# Patient Record
Sex: Male | Born: 1935 | Race: White | Hispanic: No | Smoking: Former smoker
Health system: Southern US, Community
[De-identification: ages and names within clinical notes are randomized; demographics above are authoritative.]

## PROBLEM LIST (undated history)

## (undated) DIAGNOSIS — N189 Chronic kidney disease, unspecified: Secondary | ICD-10-CM

## (undated) DIAGNOSIS — J189 Pneumonia, unspecified organism: Secondary | ICD-10-CM

## (undated) DIAGNOSIS — K219 Gastro-esophageal reflux disease without esophagitis: Secondary | ICD-10-CM

## (undated) DIAGNOSIS — E785 Hyperlipidemia, unspecified: Secondary | ICD-10-CM

## (undated) DIAGNOSIS — C801 Malignant (primary) neoplasm, unspecified: Secondary | ICD-10-CM

## (undated) DIAGNOSIS — T4145XA Adverse effect of unspecified anesthetic, initial encounter: Secondary | ICD-10-CM

## (undated) DIAGNOSIS — G4730 Sleep apnea, unspecified: Secondary | ICD-10-CM

## (undated) DIAGNOSIS — I10 Essential (primary) hypertension: Secondary | ICD-10-CM

## (undated) DIAGNOSIS — T8859XA Other complications of anesthesia, initial encounter: Secondary | ICD-10-CM

## (undated) DIAGNOSIS — H3530 Unspecified macular degeneration: Secondary | ICD-10-CM

## (undated) HISTORY — PX: UVULOPALATOPHARYNGOPLASTY: SHX827

## (undated) HISTORY — PX: CATARACT EXTRACTION, BILATERAL: SHX1313

## (undated) HISTORY — PX: BACK SURGERY: SHX140

---

## 2006-09-16 ENCOUNTER — Ambulatory Visit: Payer: Self-pay | Admitting: Physician Assistant

## 2006-12-15 ENCOUNTER — Ambulatory Visit: Payer: Self-pay | Admitting: Unknown Physician Specialty

## 2006-12-28 ENCOUNTER — Inpatient Hospital Stay: Payer: Self-pay | Admitting: Unknown Physician Specialty

## 2007-03-17 ENCOUNTER — Ambulatory Visit: Payer: Self-pay | Admitting: Internal Medicine

## 2010-06-18 ENCOUNTER — Ambulatory Visit
Admission: RE | Admit: 2010-06-18 | Discharge: 2010-06-18 | Payer: Self-pay | Source: Home / Self Care | Attending: Family Medicine | Admitting: Family Medicine

## 2010-07-17 NOTE — Assessment & Plan Note (Signed)
Summary: cold?/jbb   Vital Signs:  Patient Profile:   77 Years Old Male CC:      Cold & URI symptoms Height:     69 inches Weight:      237 pounds BMI:     35.13 O2 Sat:      94 % O2 treatment:    Room Air Temp:     97.7 degrees F tympanic Pulse rate:   76 / minute Pulse rhythm:   regular Resp:     20 per minute BP sitting:   147 / 82  (right arm)  Pt. in pain?   no  Vitals Entered By: Levonne Spiller EMT-P (June 18, 2010 12:42 PM)              Is Patient Diabetic? No  Does patient need assistance? Functional Status Self care Ambulation Normal      Current Allergies: No known allergies History of Present Illness History from: patient Chief Complaint: Cold & URI symptoms History of Present Illness: The patient is reporting that he has been fighting cold and congestion for 2 weeks.  He says that 2 weeks ago he called his PCP and received a Zpack and his symptoms started to improve but never fully resolved and symptoms returned worse after he completed the Zpack.  He is coughing and producing yellow sputum and he has been wheezing at night.  He reports that he has had sinus drainage and pressure.  He has been having some nasal congestion and low grade fever.  The patient says that he had his flu vaccine this season and is current with his pneumonia vaccine.  The patient says that he had bilateral pneumonia about 20 years ago.     REVIEW OF SYSTEMS Constitutional Symptoms       Complains of fever, chills, and night sweats.     Denies weight loss, weight gain, and fatigue.  Eyes       Denies change in vision, eye pain, eye discharge, glasses, contact lenses, and eye surgery. Ear/Nose/Throat/Mouth       Complains of ear pain, frequent runny nose, sinus problems, and hoarseness.      Denies hearing loss/aids, change in hearing, ear discharge, dizziness, frequent nose bleeds, sore throat, and tooth pain or bleeding.  Respiratory       Complains of productive cough and  wheezing.      Denies dry cough, shortness of breath, asthma, bronchitis, and emphysema/COPD.      Comments: Colored Sputum Cardiovascular       Denies murmurs, chest pain, and tires easily with exhertion.    Gastrointestinal       Denies stomach pain, nausea/vomiting, diarrhea, constipation, blood in bowel movements, and indigestion. Genitourniary       Denies painful urination, kidney stones, and loss of urinary control. Neurological       Denies paralysis, seizures, and fainting/blackouts. Musculoskeletal       Denies muscle pain, joint pain, joint stiffness, decreased range of motion, redness, swelling, muscle weakness, and gout.  Skin       Denies bruising, unusual mles/lumps or sores, and hair/skin or nail changes.  Psych       Denies mood changes, temper/anger issues, anxiety/stress, speech problems, depression, and sleep problems. Other Comments: Bilateral Pain (Minor) Pt. also has Minor wheezing bilaterally.   Past History:  Past Medical History: Hypertension Hyperlipidemia  Past Surgical History: Back Surgery  Family History: Pt only reports that parents and siblings are deceased.  Social History: Pt is married.  His wife is currently suffering with dementia but still [place].  Pt smoked for 12 years but quit in the 1940s.   Physical Exam General appearance: well developed, well nourished, no acute distress Head: normocephalic, atraumatic Eyes: conjunctivae and lids normal Pupils: equal, round, reactive to light Ears: normal, no lesions or deformities Nasal: swollen red turbinates with congestion, thick yellow discharge Oral/Pharynx: tongue normal, posterior pharynx without erythema or exudate Neck: neck supple,  trachea midline, no masses Chest/Lungs: anterior apical exp wheezes in the right lung heard, no crackles Heart: regular rate and  rhythm, no murmur Abdomen: soft, non-tender without obvious organomegaly Extremities: normal  extremities Neurological: grossly intact and non-focal Skin: no obvious rashes or lesions MSE: oriented to time, place, and person Assessment New Problems: ACUTE SINUSITIS, UNSPECIFIED (ICD-461.9) ACUTE BRONCHITIS (ICD-466.0)   Patient Education: Patient and/or caregiver instructed in the following: rest, fluids. The risks, benefits and possible side effects were clearly explained and discussed with the patient.  The patient verbalized clear understanding.  The patient was given instructions to return if symptoms don't improve, worsen or new changes develop.  If it is not during clinic hours and the patient cannot get back to this clinic then the patient was told to seek medical care at an available urgent care or emergency department.  The patient verbalized understanding.   Demonstrates willingness to comply.  Plan New Medications/Changes: DELSYM 30 MG/5ML LQCR (DEXTROMETHORPHAN POLISTIREX) take 1 teaspoon by mouth every 12 hours as needed cough  #60 mL x 0, 06/18/2010, Clanford Zeidan MD VENTOLIN HFA 108 (90 BASE) MCG/ACT AERS (ALBUTEROL SULFATE) 2 puffs every 4 hours as needed wheezing, cough, SOB  #1 x 0, 06/18/2010, Clanford Choo MD DOXYCYCLINE HYCLATE 100 MG CAPS (DOXYCYCLINE HYCLATE) take 1 by mouth two times a day with food until completed  #20 x 0, 06/18/2010, Clanford Labate MD  Follow Up: Follow up in 2-3 days if no improvement, Follow up on an as needed basis, Follow up with Primary Physician  The patient and/or caregiver has been counseled thoroughly with regard to medications prescribed including dosage, schedule, interactions, rationale for use, and possible side effects and they verbalize understanding.  Diagnoses and expected course of recovery discussed and will return if not improved as expected or if the condition worsens. Patient and/or caregiver verbalized understanding.  Prescriptions: DELSYM 30 MG/5ML LQCR (DEXTROMETHORPHAN POLISTIREX) take 1 teaspoon by mouth  every 12 hours as needed cough  #60 mL x 0   Entered and Authorized by:   Standley Dakins MD   Signed by:   Standley Dakins MD on 06/18/2010   Method used:   Electronically to        [facility] [phone]* (retail)       [address] [place], [place]  [address]       Ph: [phone]       Fax: [phone]   RxID:   [id number] VENTOLIN HFA 108 (90 BASE) MCG/ACT AERS (ALBUTEROL SULFATE) 2 puffs every 4 hours as needed wheezing, cough, SOB  #1 x 0   Entered and Authorized by:   Standley Dakins MD   Signed by:   Standley Dakins MD on 06/18/2010   Method used:   Electronically to        [facility] [employer] [phone]* (retail)       [address]  [place], [place]  [address]       Ph: [phone]       Fax: [phone]   RxID:   [id number] DOXYCYCLINE HYCLATE 100 MG CAPS (DOXYCYCLINE HYCLATE) take 1 by mouth two times a day with food until completed  #20 x 0   Entered and Authorized by:   Standley Dakins MD   Signed by:   Standley Dakins MD on 06/18/2010   Method used:   Electronically to        [facility] [phone]* (retail)       [address] [place], [place]  [address]       Ph: [phone]       Fax: [phone]   RxID:   [id number]   Patient Instructions: 1)  Take your antibiotic as prescribed until ALL of it is gone, but stop if you develop a rash or swelling and contact our office as soon as possible. 2)  Acute sinusitis symptoms for less than 10 days are not helped by antibiotics.Use warm moist compresses, and over the counter decongestants ( only as directed). Call if no improvement in 5-7 days, sooner if increasing pain, fever, or new symptoms. 3)  Acute bronchitis symptoms for less than 10 days are not helped by antibiotics. take over the counter cough medications. call if no improvment in  5-7 days, sooner if increasing cough, fever, or new symptoms( shortness of breath, chest pain). 4)  Return or go to the ER if no  improvement or symptoms getting worse.   5)  The patient was informed that there is no on-call provider or services available at this clinic during off-hours (when the clinic is closed).  If the patient developed a problem or concern that required immediate attention, the patient was advised to go the the nearest available urgent care or emergency department for medical care.  The patient verbalized understanding.    6)  Go to the pharmacy and pick up your prescription (s).  It may take up to 30 mins for electronic prescriptions to be delivered to the pharmacy.  Please call if your pharmacy has not received your prescriptions after 30 minutes.    Medication Administration  Injection # 1:    Medication: Depo- Medrol 80mg     Diagnosis: ACUTE BRONCHITIS (ICD-466.0)    Route: IM    Site: RUOQ gluteus    Exp Date: 01/13/2011    Lot #: [id number]    Mfr: PFIZER    Patient tolerated injection without complications    Given by: Levonne Spiller EMT-P (June 18, 2010 1:13 PM)  Medication # 1:    Medication: Albuterol Sulfate Sol 2.5mg  unit dose    Diagnosis: URI    Dose: 1    Route: inhaled    Exp Date: 06/15/2011    Lot #: [id number]    Mfr: NEPHRON Rx    Patient tolerated medication without complications    Given by: Levonne Spiller EMT-P (June 18, 2010 1:14 PM)

## 2010-08-14 ENCOUNTER — Encounter: Payer: Self-pay | Admitting: Family Medicine

## 2010-08-14 ENCOUNTER — Ambulatory Visit (INDEPENDENT_AMBULATORY_CARE_PROVIDER_SITE_OTHER): Payer: Medicare Other | Admitting: Family Medicine

## 2010-08-14 DIAGNOSIS — I10 Essential (primary) hypertension: Secondary | ICD-10-CM | POA: Insufficient documentation

## 2010-08-14 DIAGNOSIS — J309 Allergic rhinitis, unspecified: Secondary | ICD-10-CM | POA: Insufficient documentation

## 2010-08-14 DIAGNOSIS — J019 Acute sinusitis, unspecified: Secondary | ICD-10-CM

## 2010-08-14 DIAGNOSIS — M5430 Sciatica, unspecified side: Secondary | ICD-10-CM | POA: Insufficient documentation

## 2010-08-21 NOTE — Assessment & Plan Note (Signed)
Summary: SINUS PROBLEM   Vital Signs:  Patient Profile:   77 Years Old Male CC:      Ear Congestion Height:     69 inches Weight:      238 pounds O2 Sat:      94 % O2 treatment:    Room Air Temp:     98.6 degrees F oral Pulse rate:   75 / minute Pulse rhythm:   regular Resp:     14 per minute BP sitting:   131 / 74  (right arm)  Pt. in pain?   no                   Current Allergies: No known allergies History of Present Illness History from: patient Chief Complaint: Ear Congestion History of Present Illness: The patient presented today with complaints of 3 to 4 days of progressive itching in the ears and nasal congestion and feeling fluid in the sinus and some dizziness.  He said that he cut grass 3 days ago and forgot to wear his mask and noticed the symptoms getting worse after that.  He denies fever and chills.  He says that he is feeling pressure in maxillary sinuses.  No CP or SOB.   He is having dry nonproductive cough.    REVIEW OF SYSTEMS Constitutional Symptoms      Denies fever, chills, night sweats, weight loss, weight gain, and fatigue.  Eyes       Denies change in vision, eye pain, eye discharge, glasses, contact lenses, and eye surgery. Ear/Nose/Throat/Mouth       Complains of dizziness, frequent runny nose, and sinus problems.      Denies hearing loss/aids, change in hearing, ear pain, ear discharge, frequent nose bleeds, sore throat, hoarseness, and tooth pain or bleeding.  Respiratory       Denies dry cough, productive cough, wheezing, shortness of breath, asthma, bronchitis, and emphysema/COPD.  Cardiovascular       Denies murmurs, chest pain, and tires easily with exhertion.    Gastrointestinal       Denies stomach pain, nausea/vomiting, diarrhea, constipation, blood in bowel movements, and indigestion. Genitourniary       Denies painful urination, kidney stones, and loss of urinary control. Neurological       Denies paralysis, seizures, and  fainting/blackouts. Musculoskeletal       Complains of muscle pain.      Denies joint pain, joint stiffness, decreased range of motion, redness, swelling, muscle weakness, and gout.      Comments: right sciatica and chronic LBP Skin       Denies bruising, unusual mles/lumps or sores, and hair/skin or nail changes.  Psych       Denies mood changes, temper/anger issues, anxiety/stress, speech problems, depression, and sleep problems.  Past History:  Past Surgical History: Last updated: 06/18/2010 Back Surgery  Family History: Last updated: 06/18/2010 Pt only reports that parents and siblings are deceased.  Social History: Last updated: 06/18/2010 Pt is married.  His wife is currently suffering with dementia but still living at [place].  Pt smoked for 12 years but quit in the 1940s.    Past Medical History: Hypertension Hyperlipidemia Allergic Rhinitis Sciatica Right Chronic LBP  Family History: Reviewed history from 06/18/2010 and no changes required. Pt only reports that parents and siblings are deceased.  Social History: Reviewed history from 06/18/2010 and no changes required. Pt is married.  His wife is currently suffering with dementia  but still living at home [place] their [place].  Pt smoked for 12 years but quit in the 1940s.   Physical Exam General appearance: well developed, well nourished, no acute distress Head: normocephalic, atraumatic Eyes: conjunctivae and lids normal Pupils: equal, round, reactive to light Ears: normal, no lesions or deformities, wax in left canal Nasal: swollen red turbinates with congestion Oral/Pharynx: tongue normal, posterior pharynx without erythema or exudate Neck: neck supple,  trachea midline, no masses Chest/Lungs: no rales, wheezes, or rhonchi bilateral, breath sounds equal without effort Heart: regular rate and  rhythm, no murmur Extremities: normal extremities Neurological: grossly intact and  non-focal Skin: no obvious rashes or lesions MSE: oriented to time, place, and person Assessment New Problems: SCIATICA, RIGHT (ICD-724.3) UNSPECIFIED ESSENTIAL HYPERTENSION (ICD-401.9) ALLERGIC RHINITIS CAUSE UNSPECIFIED (ICD-477.9)   Patient Education: Patient and/or caregiver instructed in the following: rest. The risks, benefits and possible side effects were clearly explained and discussed with the patient.  The patient verbalized clear understanding.  The patient was given instructions to return if symptoms don't improve, worsen or new changes develop.  If it is not during clinic hours and the patient cannot get back to this clinic then the patient was told to seek medical care at an available urgent care or emergency department.  The patient verbalized understanding.   Demonstrates willingness to comply.  Plan New Medications/Changes: LORATADINE 10 MG TABS (LORATADINE) take 1 by mouth daily  #20 x 0, 08/14/2010, Clanford Joost MD FLUTICASONE PROPIONATE 50 MCG/ACT SUSP (FLUTICASONE PROPIONATE) 2 sprays per nostril once daily  #1 x 1, 08/14/2010, Clanford Downum MD AZITHROMYCIN 250 MG TABS (AZITHROMYCIN) take 2 tabs by mouth on day 1, then 1 tab by mouth daily until completed  #6 x 0, 08/14/2010, Clanford Gasper MD  Planning Comments:   Wear Nasal/Oral Mask when cutting grass.  Leave windows closed during high pollen season.  Follow Up: Follow up in 2-3 days if no improvement, Follow up on an as needed basis, Follow up with Primary Physician  The patient and/or caregiver has been counseled thoroughly with regard to medications prescribed including dosage, schedule, interactions, rationale for use, and possible side effects and they verbalize understanding.  Diagnoses and expected course of recovery discussed and will return if not improved as expected or if the condition worsens. Patient and/or caregiver verbalized understanding.  Prescriptions: LORATADINE 10 MG TABS  (LORATADINE) take 1 by mouth daily  #20 x 0   Entered and Authorized by:   Standley Dakins MD   Signed by:   Standley Dakins MD on 08/14/2010   Method used:   Electronically to        [facility] [phone]* (retail)       [address] [place], [place]  [address]       Ph: [phone]       Fax: [phone]   RxID:   [id number] FLUTICASONE PROPIONATE 50 MCG/ACT SUSP (FLUTICASONE PROPIONATE) 2 sprays per nostril once daily  #1 x 1   Entered and Authorized by:   Standley Dakins MD   Signed by:   Standley Dakins MD on 08/14/2010   Method used:   Electronically to        [facility] [phone]* (retail)       [address] [place], [place]  [address]       Ph: [phone]       Fax: [phone]  RxID:   [id number] AZITHROMYCIN 250 MG TABS (AZITHROMYCIN) take 2 tabs by mouth on day 1, then 1 tab by mouth daily until completed  #6 x 0   Entered and Authorized by:   Standley Dakins MD   Signed by:   Standley Dakins MD on 08/14/2010   Method used:   Electronically to        [facility] [phone]* (retail)       [address] [place], [place]  [address]       Ph: [phone]       Fax: [phone]   RxID:   [id number]   Patient Instructions: 1)  Go to the pharmacy and pick up your prescription (s).  It may take up to 30 mins for electronic prescriptions to be delivered to the pharmacy.  Please call if your pharmacy has not received your prescriptions after 30 minutes.   2)  Take your antibiotic as prescribed until ALL of it is gone, but stop if you develop a rash or swelling and contact our office as soon as possible. 3)  Acute sinusitis symptoms for less than 10 days are not helped by antibiotics.Use warm moist compresses, and over the counter decongestants ( only as directed). Call if no improvement in 5-7 days, sooner if increasing pain, fever, or new symptoms. 4)  The patient was informed that there is no on-call provider or  services available at this clinic during off-hours (when the clinic is closed).  If the patient developed a problem or concern that required immediate attention, the patient was advised to go the the nearest available urgent care or emergency department for medical care.  The patient verbalized understanding.    5)  Return or go to the ER if no improvement or symptoms getting worse.

## 2010-08-27 ENCOUNTER — Ambulatory Visit (INDEPENDENT_AMBULATORY_CARE_PROVIDER_SITE_OTHER): Payer: Medicare Other | Admitting: Internal Medicine

## 2010-08-27 ENCOUNTER — Encounter: Payer: Self-pay | Admitting: Internal Medicine

## 2010-08-27 DIAGNOSIS — E860 Dehydration: Secondary | ICD-10-CM | POA: Insufficient documentation

## 2010-08-27 DIAGNOSIS — G4700 Insomnia, unspecified: Secondary | ICD-10-CM | POA: Insufficient documentation

## 2010-08-27 DIAGNOSIS — H8309 Labyrinthitis, unspecified ear: Secondary | ICD-10-CM

## 2010-09-02 NOTE — Assessment & Plan Note (Signed)
Summary: inner ear, allergies/jbb   Vital Signs:  Patient profile:   77 year old male Temp:     98.2 degrees F Pulse rate:   72 / minute Resp:     14 per minute BP sitting:   108 / 58  (left arm)  CC:  "inner ear" for 2 days.  History of Present Illness: Patient here 3/1 for allergic rhinnitis and is better but not back to baseline. Loratidine didn't seem to help. Pollen counts are very high with extreme warm weather this year. 2 days ago started to have mild dizziness with spinning and assoc with ears popping. Worse with rapid head movement. Had this yrs ago once and didn't last long. Denies diplopia, headache, syncope, weakness, numbness, etc. Has had darker and scanter urine and thinks he doesn't drink enough. Also requested sleeping pills since it takes him 2 hrs to get to sleep. Doesn't feel tired. Sometimes naps.  Allergies: No Known Drug Allergies  Past History:  Past Medical History: Last updated: 08/14/2010 Hypertension Hyperlipidemia Allergic Rhinitis Sciatica Right Chronic LBP  Past Surgical History: Last updated: 06/18/2010 Back Surgery  Social History: Last updated: 06/18/2010 Pt is married.  His wife is currently suffering with dementia but still living at home [place] their [place].  Pt smoked for 12 years but quit in the 1940s.    Review of Systems  The patient denies anorexia, fever, weight loss, weight gain, vision loss, decreased hearing, hoarseness, chest pain, syncope, dyspnea on exertion, peripheral edema, prolonged cough, headaches, hemoptysis, abdominal pain, melena, hematochezia, severe indigestion/heartburn, hematuria, incontinence, genital sores, muscle weakness, suspicious skin lesions, transient blindness, difficulty walking, depression, unusual weight change, abnormal bleeding, enlarged lymph nodes, angioedema, breast masses, and testicular masses.   ENT:  Complains of nasal congestion and postnasal drainage; ears popping, sneezing.Marland Kitchen  Physical  Exam  General:  Well-developed,well-nourished,in no acute distress; alert,appropriate and cooperative throughout examination Head:  Normocephalic and atraumatic without obvious abnormalities. No apparent alopecia or balding. Eyes:  No corneal or conjunctival inflammation noted. EOMI. Perrla.  No nystagmus. Vision grossly normal. Ears:  External ear exam shows no significant lesions or deformities.  Otoscopic examination reveals much cerumen but visible tympanic membranes are clear. Hearing is grossly normal bilaterally. Nose:  mucosal pallor Mouth:  Oral mucosa and oropharynx without lesions, pnd, or exudates.   Neck:  No deformities, masses, or tenderness noted. carotids 2+ without bruits Lungs:  Normal respiratory effort, chest expands symmetrically. Lungs are clear to auscultation, no crackles or wheezes. Heart:  Normal rate and regular rhythm.  Extremities:  No clubbing, cyanosis, edema.   Neurologic:  No cranial nerve deficits noted. Station and gait are normal.  Sensory, motor and coordinative functions appear intact. Cervical Nodes:  No lymphadenopathy noted Psych:  Cognition and judgment appear intact. Alert and cooperative with normal attention span and concentration. No apparent delusions, illusions, hallucinations   Problems:  Medical Problems Added: 1)  Dx of Insomnia Unspecified  (ICD-780.52) 2)  Dx of Dehydration  (ICD-276.51) 3)  Dx of Labrynthitis  (ICD-386.30)  Impression & Recommendations:  Problem # 1:  INSOMNIA UNSPECIFIED (ICD-780.52) mild  Problem # 2:  DEHYDRATION (ICD-276.51) mild  Problem # 3:  LABRYNTHITIS (ICD-386.30) Assessment: Comment Only mild. Discussed need for CT/MRI if progressed or persisted  Problem # 4:  UNSPECIFIED ESSENTIAL HYPERTENSION (ICD-401.9) Discussed possible complication of decongestants and need to monitor at home with own equip. His updated medication list for this problem includes:    Hydrochlorothiazide 25 Mg Tabs  (  Hydrochlorothiazide) .Marland Kitchen... 1 tab per day    Amlodipine Besylate 5 Mg Tabs (Amlodipine besylate) .Marland Kitchen... 1 tab per day    Lisinopril 40 Mg Tabs (Lisinopril) .Marland Kitchen... 1 tab per day  Complete Medication List: 1)  Hydrochlorothiazide 25 Mg Tabs (Hydrochlorothiazide) .Marland Kitchen.. 1 tab per day 2)  Travatan Z 0.004 % Soln (Travoprost) .Marland Kitchen.. 1 drop each eye at bedtime 3)  Amlodipine Besylate 5 Mg Tabs (Amlodipine besylate) .Marland Kitchen.. 1 tab per day 4)  Lisinopril 40 Mg Tabs (Lisinopril) .Marland Kitchen.. 1 tab per day 5)  Simvastatin 40 Mg Tabs (Simvastatin) .Marland Kitchen.. 1 tab per day 6)  Latanoprost 0.005 % Soln (Latanoprost) .Marland Kitchen.. 1 drop each eye at bedtime 7)  Ventolin Hfa 108 (90 Base) Mcg/act Aers (Albuterol sulfate) .... 2 puffs every 4 hours as needed wheezing, cough, sob 8)  Delsym 30 Mg/104ml Lqcr (Dextromethorphan polistirex) .... Take 1 teaspoon by mouth every 12 hours as needed cough 9)  Fluticasone Propionate 50 Mcg/act Susp (Fluticasone propionate) .... 2 sprays per nostril once daily 10)  Loratadine 10 Mg Tabs (Loratadine) .... Take 1 by mouth daily 11)  Antivert 25 Mg Tabs (Meclizine hcl) .Marland Kitchen.. 1 by mouth four times daily as needed  dizziness 12)  Gabapentin 600 Mg Tabs (Gabapentin) .Marland Kitchen.. 1 by mouth qhs  Patient Instructions: 1)  afrin generic 2 sprays in each nostril every 12 hours for 3 days maximum followed by 3 days of non-use. Continue cycle as needed to control nasal or ear congestion . 2)  loratidine-D every 12 hrs and check your BP at home.140/90 3)  careful activity such as driving, stair climbing. 4)  If worse or not better soon, consult Dr. Bennett Scrape or ENT. 5)  Increase water intake. 6)  Avoid stimulants such as caffeine. 7)  Don't nap. 8)  Relaxation routine. 9)  melatonin. Prescriptions: ANTIVERT 25 MG TABS (MECLIZINE HCL) 1 by mouth four times daily as needed  dizziness  #30 x 1   Entered and Authorized by:   J. Juline Patch MD   Signed by:   Shela Commons. Juline Patch MD on 08/27/2010   Method used:   Electronically  to        [facility] [phone]* (retail)       [address] [place], [place]  [address]       Ph: [phone]       Fax: [phone]   RxID:   [id number]

## 2010-09-11 NOTE — Letter (Signed)
Summary: history form   history form   Imported By: Eugenio Hoes 09/01/2010 14:50:57  _____________________________________________________________________  External Attachment:    Type:   Image     Comment:   External Document

## 2010-09-16 NOTE — Letter (Signed)
Summary: History form  History form   Imported By: Eugenio Hoes 09/08/2010 09:49:55  _____________________________________________________________________  External Attachment:    Type:   Image     Comment:   External Document

## 2013-04-05 DIAGNOSIS — C44320 Squamous cell carcinoma of skin of unspecified parts of face: Secondary | ICD-10-CM | POA: Insufficient documentation

## 2013-05-25 ENCOUNTER — Ambulatory Visit: Payer: Self-pay | Admitting: Internal Medicine

## 2013-06-26 ENCOUNTER — Ambulatory Visit: Payer: Self-pay | Admitting: Internal Medicine

## 2013-12-26 ENCOUNTER — Ambulatory Visit: Payer: Self-pay | Admitting: Internal Medicine

## 2014-09-25 ENCOUNTER — Ambulatory Visit
Admit: 2014-09-25 | Disposition: A | Discharge status: Home / Self Care | Payer: Self-pay | Attending: Internal Medicine | Admitting: Internal Medicine

## 2015-04-19 ENCOUNTER — Other Ambulatory Visit: Payer: Self-pay | Admitting: Internal Medicine

## 2015-04-19 DIAGNOSIS — R911 Solitary pulmonary nodule: Secondary | ICD-10-CM

## 2015-04-22 ENCOUNTER — Ambulatory Visit
Admission: RE | Admit: 2015-04-22 | Discharge: 2015-04-22 | Disposition: A | Discharge status: Home / Self Care | Payer: Commercial Managed Care - HMO | Source: Ambulatory Visit | Attending: Internal Medicine | Admitting: Internal Medicine

## 2015-04-22 DIAGNOSIS — I2510 Atherosclerotic heart disease of native coronary artery without angina pectoris: Secondary | ICD-10-CM | POA: Diagnosis not present

## 2015-04-22 DIAGNOSIS — R911 Solitary pulmonary nodule: Secondary | ICD-10-CM | POA: Diagnosis not present

## 2015-04-22 HISTORY — DX: Malignant (primary) neoplasm, unspecified: C80.1

## 2015-04-22 HISTORY — DX: Essential (primary) hypertension: I10

## 2015-04-22 MED ORDER — IOHEXOL 300 MG/ML  SOLN
75.0000 mL | Freq: Once | INTRAMUSCULAR | Status: AC | PRN
  Administered 2015-04-22: 75 mL via INTRAVENOUS

## 2015-08-06 DIAGNOSIS — R911 Solitary pulmonary nodule: Secondary | ICD-10-CM | POA: Diagnosis not present

## 2015-08-06 DIAGNOSIS — M150 Primary generalized (osteo)arthritis: Secondary | ICD-10-CM | POA: Diagnosis not present

## 2015-08-06 DIAGNOSIS — Z23 Encounter for immunization: Secondary | ICD-10-CM | POA: Diagnosis not present

## 2015-08-06 DIAGNOSIS — I10 Essential (primary) hypertension: Secondary | ICD-10-CM | POA: Diagnosis not present

## 2015-08-06 DIAGNOSIS — E7800 Pure hypercholesterolemia, unspecified: Secondary | ICD-10-CM | POA: Diagnosis not present

## 2015-08-06 DIAGNOSIS — G4733 Obstructive sleep apnea (adult) (pediatric): Secondary | ICD-10-CM | POA: Diagnosis not present

## 2015-08-13 DIAGNOSIS — M150 Primary generalized (osteo)arthritis: Secondary | ICD-10-CM | POA: Diagnosis not present

## 2015-08-13 DIAGNOSIS — F5105 Insomnia due to other mental disorder: Secondary | ICD-10-CM | POA: Diagnosis not present

## 2015-08-13 DIAGNOSIS — F4321 Adjustment disorder with depressed mood: Secondary | ICD-10-CM | POA: Diagnosis not present

## 2015-08-13 DIAGNOSIS — E7800 Pure hypercholesterolemia, unspecified: Secondary | ICD-10-CM | POA: Diagnosis not present

## 2015-08-13 DIAGNOSIS — Z23 Encounter for immunization: Secondary | ICD-10-CM | POA: Diagnosis not present

## 2015-08-13 DIAGNOSIS — I10 Essential (primary) hypertension: Secondary | ICD-10-CM | POA: Diagnosis not present

## 2015-08-28 DIAGNOSIS — H40053 Ocular hypertension, bilateral: Secondary | ICD-10-CM | POA: Diagnosis not present

## 2015-08-30 DIAGNOSIS — Z85828 Personal history of other malignant neoplasm of skin: Secondary | ICD-10-CM | POA: Diagnosis not present

## 2015-08-30 DIAGNOSIS — D1801 Hemangioma of skin and subcutaneous tissue: Secondary | ICD-10-CM | POA: Diagnosis not present

## 2015-08-30 DIAGNOSIS — L821 Other seborrheic keratosis: Secondary | ICD-10-CM | POA: Diagnosis not present

## 2015-08-30 DIAGNOSIS — L570 Actinic keratosis: Secondary | ICD-10-CM | POA: Diagnosis not present

## 2015-09-17 DIAGNOSIS — I10 Essential (primary) hypertension: Secondary | ICD-10-CM | POA: Diagnosis not present

## 2015-09-17 DIAGNOSIS — E7800 Pure hypercholesterolemia, unspecified: Secondary | ICD-10-CM | POA: Diagnosis not present

## 2015-09-17 DIAGNOSIS — M150 Primary generalized (osteo)arthritis: Secondary | ICD-10-CM | POA: Diagnosis not present

## 2015-09-17 DIAGNOSIS — R0789 Other chest pain: Secondary | ICD-10-CM | POA: Diagnosis not present

## 2015-11-15 ENCOUNTER — Other Ambulatory Visit: Payer: Self-pay | Admitting: Internal Medicine

## 2015-11-15 ENCOUNTER — Ambulatory Visit
Admission: RE | Admit: 2015-11-15 | Discharge: 2015-11-15 | Disposition: A | Discharge status: Home / Self Care | Payer: PPO | Source: Ambulatory Visit | Attending: Internal Medicine | Admitting: Internal Medicine

## 2015-11-15 DIAGNOSIS — I10 Essential (primary) hypertension: Secondary | ICD-10-CM | POA: Diagnosis not present

## 2015-11-15 DIAGNOSIS — M5136 Other intervertebral disc degeneration, lumbar region: Secondary | ICD-10-CM | POA: Diagnosis not present

## 2015-11-15 DIAGNOSIS — I700 Atherosclerosis of aorta: Secondary | ICD-10-CM | POA: Diagnosis not present

## 2015-11-15 DIAGNOSIS — M150 Primary generalized (osteo)arthritis: Secondary | ICD-10-CM | POA: Diagnosis not present

## 2015-11-15 DIAGNOSIS — K4090 Unilateral inguinal hernia, without obstruction or gangrene, not specified as recurrent: Secondary | ICD-10-CM | POA: Insufficient documentation

## 2015-11-15 DIAGNOSIS — Z77090 Contact with and (suspected) exposure to asbestos: Secondary | ICD-10-CM | POA: Diagnosis not present

## 2015-11-15 DIAGNOSIS — Z87442 Personal history of urinary calculi: Secondary | ICD-10-CM | POA: Insufficient documentation

## 2015-11-15 DIAGNOSIS — J948 Other specified pleural conditions: Secondary | ICD-10-CM | POA: Diagnosis not present

## 2015-11-15 DIAGNOSIS — R918 Other nonspecific abnormal finding of lung field: Secondary | ICD-10-CM | POA: Insufficient documentation

## 2015-11-15 DIAGNOSIS — K5730 Diverticulosis of large intestine without perforation or abscess without bleeding: Secondary | ICD-10-CM | POA: Insufficient documentation

## 2015-11-15 DIAGNOSIS — R1032 Left lower quadrant pain: Secondary | ICD-10-CM | POA: Diagnosis not present

## 2015-11-15 DIAGNOSIS — K8020 Calculus of gallbladder without cholecystitis without obstruction: Secondary | ICD-10-CM | POA: Diagnosis not present

## 2015-11-15 DIAGNOSIS — N281 Cyst of kidney, acquired: Secondary | ICD-10-CM | POA: Diagnosis not present

## 2015-11-15 DIAGNOSIS — E7800 Pure hypercholesterolemia, unspecified: Secondary | ICD-10-CM | POA: Diagnosis not present

## 2015-11-15 DIAGNOSIS — Z9889 Other specified postprocedural states: Secondary | ICD-10-CM | POA: Insufficient documentation

## 2015-11-15 MED ORDER — IOPAMIDOL (ISOVUE-300) INJECTION 61%
100.0000 mL | Freq: Once | INTRAVENOUS | Status: AC | PRN
  Administered 2015-11-15: 100 mL via INTRAVENOUS

## 2015-11-27 DIAGNOSIS — F4321 Adjustment disorder with depressed mood: Secondary | ICD-10-CM | POA: Diagnosis not present

## 2015-11-27 DIAGNOSIS — F5105 Insomnia due to other mental disorder: Secondary | ICD-10-CM | POA: Diagnosis not present

## 2015-11-27 DIAGNOSIS — E7800 Pure hypercholesterolemia, unspecified: Secondary | ICD-10-CM | POA: Diagnosis not present

## 2015-11-27 DIAGNOSIS — M150 Primary generalized (osteo)arthritis: Secondary | ICD-10-CM | POA: Diagnosis not present

## 2015-11-27 DIAGNOSIS — Z23 Encounter for immunization: Secondary | ICD-10-CM | POA: Diagnosis not present

## 2015-11-27 DIAGNOSIS — I10 Essential (primary) hypertension: Secondary | ICD-10-CM | POA: Diagnosis not present

## 2015-12-04 DIAGNOSIS — R972 Elevated prostate specific antigen [PSA]: Secondary | ICD-10-CM | POA: Diagnosis not present

## 2015-12-04 DIAGNOSIS — F5105 Insomnia due to other mental disorder: Secondary | ICD-10-CM | POA: Diagnosis not present

## 2015-12-04 DIAGNOSIS — F4321 Adjustment disorder with depressed mood: Secondary | ICD-10-CM | POA: Diagnosis not present

## 2015-12-04 DIAGNOSIS — M150 Primary generalized (osteo)arthritis: Secondary | ICD-10-CM | POA: Diagnosis not present

## 2015-12-04 DIAGNOSIS — E7800 Pure hypercholesterolemia, unspecified: Secondary | ICD-10-CM | POA: Diagnosis not present

## 2015-12-04 DIAGNOSIS — I10 Essential (primary) hypertension: Secondary | ICD-10-CM | POA: Diagnosis not present

## 2015-12-04 DIAGNOSIS — K4090 Unilateral inguinal hernia, without obstruction or gangrene, not specified as recurrent: Secondary | ICD-10-CM | POA: Diagnosis not present

## 2015-12-04 DIAGNOSIS — E782 Mixed hyperlipidemia: Secondary | ICD-10-CM | POA: Diagnosis not present

## 2015-12-04 DIAGNOSIS — Z6837 Body mass index (BMI) 37.0-37.9, adult: Secondary | ICD-10-CM | POA: Diagnosis not present

## 2016-01-08 DIAGNOSIS — K4090 Unilateral inguinal hernia, without obstruction or gangrene, not specified as recurrent: Secondary | ICD-10-CM | POA: Diagnosis not present

## 2016-01-14 DIAGNOSIS — E7800 Pure hypercholesterolemia, unspecified: Secondary | ICD-10-CM | POA: Diagnosis not present

## 2016-01-14 DIAGNOSIS — G4733 Obstructive sleep apnea (adult) (pediatric): Secondary | ICD-10-CM | POA: Diagnosis not present

## 2016-01-14 DIAGNOSIS — Z01810 Encounter for preprocedural cardiovascular examination: Secondary | ICD-10-CM | POA: Diagnosis not present

## 2016-01-14 DIAGNOSIS — I10 Essential (primary) hypertension: Secondary | ICD-10-CM | POA: Diagnosis not present

## 2016-01-15 DIAGNOSIS — G4733 Obstructive sleep apnea (adult) (pediatric): Secondary | ICD-10-CM | POA: Diagnosis not present

## 2016-01-15 DIAGNOSIS — I10 Essential (primary) hypertension: Secondary | ICD-10-CM | POA: Diagnosis not present

## 2016-01-15 DIAGNOSIS — E7800 Pure hypercholesterolemia, unspecified: Secondary | ICD-10-CM | POA: Diagnosis not present

## 2016-01-15 DIAGNOSIS — Z01810 Encounter for preprocedural cardiovascular examination: Secondary | ICD-10-CM | POA: Diagnosis not present

## 2016-01-16 ENCOUNTER — Encounter
Admission: RE | Admit: 2016-01-16 | Discharge: 2016-01-16 | Disposition: A | Discharge status: Home / Self Care | Payer: PPO | Source: Ambulatory Visit | Attending: Surgery | Admitting: Surgery

## 2016-01-16 DIAGNOSIS — Z01818 Encounter for other preprocedural examination: Secondary | ICD-10-CM | POA: Insufficient documentation

## 2016-01-16 HISTORY — DX: Unspecified macular degeneration: H35.30

## 2016-01-16 HISTORY — DX: Gastro-esophageal reflux disease without esophagitis: K21.9

## 2016-01-16 HISTORY — DX: Hyperlipidemia, unspecified: E78.5

## 2016-01-16 HISTORY — DX: Adverse effect of unspecified anesthetic, initial encounter: T41.45XA

## 2016-01-16 HISTORY — DX: Other complications of anesthesia, initial encounter: T88.59XA

## 2016-01-16 HISTORY — DX: Sleep apnea, unspecified: G47.30

## 2016-01-16 NOTE — Pre-Procedure Instructions (Signed)
Notified Dawn at office of patient allergy to PCN

## 2016-01-16 NOTE — Patient Instructions (Signed)
Your procedure is scheduled on: Friday 01/24/16 Report to Day Surgery. 2ND FLOOR MEDICAL MALL ENTRANCE To find out your arrival time please call [phone] between 1PM - 3PM on Thursday 01/23/16.  Remember: Instructions that are not followed completely may result in serious medical risk, up to and including death, or upon the discretion of your surgeon and anesthesiologist your surgery may need to be rescheduled.    __X__ 1. Do not eat food or drink liquids after midnight. No gum chewing or hard candies.     __X__ 2. No Alcohol for 24 hours before or after surgery.   ____ 3. Bring all medications with you on the day of surgery if instructed.    __X__ 4. Notify your doctor if there is any change in your medical condition     (cold, fever, infections).     Do not wear jewelry, make-up, hairpins, clips or nail polish.  Do not wear lotions, powders, or perfumes.   Do not shave 48 hours prior to surgery. Men may shave face and neck.  Do not bring valuables to the hospital.    [facility] is not responsible for any belongings or valuables.               Contacts, dentures or bridgework may not be worn into surgery.  Leave your suitcase in the car. After surgery it may be brought to your room.  For patients admitted to the hospital, discharge time is determined by your                treatment team.   Patients discharged the day of surgery will not be allowed to drive home.   Please read over the following fact sheets that you were given:   Surgical Site Infection Prevention   ____ Take these medicines the morning of surgery with A SIP OF WATER:    1. NONE  2.   3.   4.  5.  6.  ____ Fleet Enema (as directed)   __X__ Use CHG Soap as directed  ____ Use inhalers on the day of surgery  ____ Stop metformin 2 days prior to surgery    ____ Take 1/2 of usual insulin dose the night before surgery and none on the morning of surgery.   ____ Stop Coumadin/Plavix/aspirin on   ____  Stop Anti-inflammatories on    ____ Stop supplements until after surgery.    ____ Bring C-Pap to the hospital.

## 2016-01-16 NOTE — Pre-Procedure Instructions (Signed)
ANESTHESIA   NM myocardial perfusion SPECT multiple (stress and rest)01/15/2016 [place] Result Impression   1.Mild reduced left ventricular function 2.Inferobasal hypokinesis 3.No definite evidence for scar or ischemia  Result Narrative  CARDIOLOGY DEPARTMENT [facility] [place], [id number]  Procedure: Pharmacologic Myocardial Perfusion Imaging ONE day procedure  Indication: Preoperative cardiovascular examination Plan: NM myocardial perfusion SPECT multiple (stress  and rest), ECG stress test only  Ordering Physician:   Dr. Isaias Cowman   Clinical History: 82 y.o. year old male Vitals: Height: 26 in Weight: 239 lb Cardiac risk factors include:  Hyperlipidemia, HTN, Family Hx CAD and Obesity    Procedure:  Pharmacologic stress testing was performed with Regadenoson using a single  use 0.4mg /54ml (0.08 mg/ml) prefilled syringe intravenously infused as a  bolus dose. The stress test was stopped due to Infusion completion.Blood  pressure response was normal.  Rest HR: 61bpm Rest BP: 144/74mmHg Max HR: 75bpm Min BP: 140/20mmHg  Stress Test Administered by: Oswald Hillock, CMA  ECG Interpretation: Rest [id number] sinus rhythm, right bundle branch block (RBBB) Stress [id number] sinus rhythm,  Recovery [id number] sinus rhythm ECG Interpretation:non-diagnostic due to pharmacologic testing.   Administrations This Visit  regadenoson (LEXISCAN) 0.4 mg/5 mL inj syringe 0.4 mg  Admin Date Action Dose Route Administered By      01/15/2016 Given 0.4 mg Intravenous Sharmon Leyden Deangelo      technetium Tc47m sestamibi (CARDIOLITE) injection [id number] millicurie  Admin Date Action Dose Route Administered By      01/15/2016 Given [id number] millicurie Intravenous Vinnie Level L Deangelo      technetium Tc23m sestamibi (CARDIOLITE)  injection [id number] millicurie  Admin Date Action Dose Route Administered By      [id number] Given [id number] millicurie Intravenous Needhi N Patel, RT (N)        Gated post-stress perfusion imaging was performed 30 minutes after stress.  Rest images were performed 30 minutes after injection.  Gated LV Analysis:  TID Ratio:   LVEF= 46%  FINDINGS: Regional wall motion:demonstrateshypokinesis of the inferior basal  wall.. The overall quality of the study is good. Artifacts noted: no Left ventricular cavity: normal.  Perfusion Analysis:SPECT images demonstrate homogeneous tracer  distribution throughout the myocardium.  Status

## 2016-01-24 ENCOUNTER — Ambulatory Visit
Admission: RE | Admit: 2016-01-24 | Discharge: 2016-01-24 | Disposition: A | Discharge status: Home / Self Care | Payer: PPO | Source: Ambulatory Visit | Attending: Surgery | Admitting: Surgery

## 2016-01-24 ENCOUNTER — Ambulatory Visit: Payer: PPO | Admitting: Anesthesiology

## 2016-01-24 ENCOUNTER — Encounter
Admission: RE | Disposition: A | Discharge status: Home / Self Care | Payer: Self-pay | Source: Ambulatory Visit | Attending: Surgery

## 2016-01-24 DIAGNOSIS — K4090 Unilateral inguinal hernia, without obstruction or gangrene, not specified as recurrent: Secondary | ICD-10-CM | POA: Diagnosis not present

## 2016-01-24 DIAGNOSIS — Z87891 Personal history of nicotine dependence: Secondary | ICD-10-CM | POA: Diagnosis not present

## 2016-01-24 DIAGNOSIS — Z85828 Personal history of other malignant neoplasm of skin: Secondary | ICD-10-CM | POA: Insufficient documentation

## 2016-01-24 DIAGNOSIS — Z6832 Body mass index (BMI) 32.0-32.9, adult: Secondary | ICD-10-CM | POA: Insufficient documentation

## 2016-01-24 DIAGNOSIS — I10 Essential (primary) hypertension: Secondary | ICD-10-CM | POA: Diagnosis not present

## 2016-01-24 DIAGNOSIS — G4733 Obstructive sleep apnea (adult) (pediatric): Secondary | ICD-10-CM | POA: Insufficient documentation

## 2016-01-24 DIAGNOSIS — E669 Obesity, unspecified: Secondary | ICD-10-CM | POA: Diagnosis not present

## 2016-01-24 DIAGNOSIS — E785 Hyperlipidemia, unspecified: Secondary | ICD-10-CM | POA: Insufficient documentation

## 2016-01-24 DIAGNOSIS — G4730 Sleep apnea, unspecified: Secondary | ICD-10-CM | POA: Diagnosis not present

## 2016-01-24 DIAGNOSIS — K219 Gastro-esophageal reflux disease without esophagitis: Secondary | ICD-10-CM | POA: Diagnosis not present

## 2016-01-24 DIAGNOSIS — Z79899 Other long term (current) drug therapy: Secondary | ICD-10-CM | POA: Insufficient documentation

## 2016-01-24 DIAGNOSIS — Z888 Allergy status to other drugs, medicaments and biological substances status: Secondary | ICD-10-CM | POA: Diagnosis not present

## 2016-01-24 HISTORY — PX: INGUINAL HERNIA REPAIR: SHX194

## 2016-01-24 SURGERY — REPAIR, HERNIA, INGUINAL, ADULT
Anesthesia: General | Site: Groin | Laterality: Left | Wound class: Clean

## 2016-01-24 MED ORDER — OXYCODONE HCL 5 MG PO TABS
5.0000 mg | ORAL_TABLET | Freq: Once | ORAL | Status: AC | PRN
  Administered 2016-01-24: 5 mg via ORAL

## 2016-01-24 MED ORDER — LIDOCAINE HCL (CARDIAC) 20 MG/ML IV SOLN
INTRAVENOUS | Status: DC | PRN
  Administered 2016-01-24: 100 mg via INTRAVENOUS

## 2016-01-24 MED ORDER — OXYCODONE HCL 5 MG PO TABS
ORAL_TABLET | ORAL | Status: AC
  Filled 2016-01-24: qty 1

## 2016-01-24 MED ORDER — PROMETHAZINE HCL 25 MG/ML IJ SOLN: 6.2500 mg | INTRAMUSCULAR | Status: DC | PRN

## 2016-01-24 MED ORDER — FAMOTIDINE 20 MG PO TABS
ORAL_TABLET | ORAL | Status: AC
  Administered 2016-01-24: 20 mg via ORAL
  Filled 2016-01-24: qty 1

## 2016-01-24 MED ORDER — FENTANYL CITRATE (PF) 100 MCG/2ML IJ SOLN
INTRAMUSCULAR | Status: DC | PRN
  Administered 2016-01-24 (×3): 50 ug via INTRAVENOUS

## 2016-01-24 MED ORDER — HYDROCODONE-ACETAMINOPHEN 5-325 MG PO TABS: 1.0000 | ORAL_TABLET | ORAL | Status: DC | PRN

## 2016-01-24 MED ORDER — MEPERIDINE HCL 25 MG/ML IJ SOLN: 6.2500 mg | INTRAMUSCULAR | Status: DC | PRN

## 2016-01-24 MED ORDER — VANCOMYCIN HCL IN DEXTROSE 1-5 GM/200ML-% IV SOLN
INTRAVENOUS | Status: AC
  Administered 2016-01-24: 1000 mg via INTRAVENOUS
  Filled 2016-01-24: qty 200

## 2016-01-24 MED ORDER — FAMOTIDINE 20 MG PO TABS
20.0000 mg | ORAL_TABLET | Freq: Once | ORAL | Status: AC
  Administered 2016-01-24: 20 mg via ORAL

## 2016-01-24 MED ORDER — LACTATED RINGERS IV SOLN
INTRAVENOUS | Status: DC
  Administered 2016-01-24 (×2): via INTRAVENOUS

## 2016-01-24 MED ORDER — VANCOMYCIN HCL IN DEXTROSE 1-5 GM/200ML-% IV SOLN
1000.0000 mg | Freq: Once | INTRAVENOUS | Status: AC
  Administered 2016-01-24: 1000 mg via INTRAVENOUS

## 2016-01-24 MED ORDER — BUPIVACAINE-EPINEPHRINE (PF) 0.5% -1:200000 IJ SOLN
INTRAMUSCULAR | Status: DC | PRN
  Administered 2016-01-24: 25 mL via PERINEURAL

## 2016-01-24 MED ORDER — PROPOFOL 10 MG/ML IV BOLUS
INTRAVENOUS | Status: DC | PRN
  Administered 2016-01-24: 25 mg via INTRAVENOUS
  Administered 2016-01-24: 80 mg via INTRAVENOUS

## 2016-01-24 MED ORDER — FENTANYL CITRATE (PF) 100 MCG/2ML IJ SOLN
25.0000 ug | INTRAMUSCULAR | Status: DC | PRN
Start: 2016-01-24 — End: 2016-01-24

## 2016-01-24 MED ORDER — EPHEDRINE SULFATE 50 MG/ML IJ SOLN
INTRAMUSCULAR | Status: DC | PRN
  Administered 2016-01-24: 15 mg via INTRAVENOUS

## 2016-01-24 MED ORDER — HYDROCODONE-ACETAMINOPHEN 5-325 MG PO TABS: 1.0000 | ORAL_TABLET | ORAL | 0 refills | Status: DC | PRN

## 2016-01-24 MED ORDER — OXYCODONE HCL 5 MG/5ML PO SOLN: 5.0000 mg | Freq: Once | ORAL | Status: AC | PRN

## 2016-01-24 MED ORDER — SUCCINYLCHOLINE CHLORIDE 20 MG/ML IJ SOLN
INTRAMUSCULAR | Status: DC | PRN
  Administered 2016-01-24: 100 mg via INTRAVENOUS

## 2016-01-24 MED ORDER — BUPIVACAINE-EPINEPHRINE (PF) 0.5% -1:200000 IJ SOLN
INTRAMUSCULAR | Status: AC
  Filled 2016-01-24: qty 30

## 2016-01-24 SURGICAL SUPPLY — 31 items
BLADE CLIPPER SURG (BLADE) ×2 IMPLANT
BLADE SURG 15 STRL LF DISP TIS (BLADE) ×1 IMPLANT
BLADE SURG 15 STRL SS (BLADE) ×3
CANISTER SUCT 1200ML W/VALVE (MISCELLANEOUS) ×3 IMPLANT
CHLORAPREP W/TINT 26ML (MISCELLANEOUS) ×3 IMPLANT
DRAIN PENROSE 5/8X18 LTX STRL (WOUND CARE) ×3 IMPLANT
DRAPE LAPAROTOMY 77X122 PED (DRAPES) ×3 IMPLANT
ELECT REM PT RETURN 9FT ADLT (ELECTROSURGICAL) ×3
ELECTRODE REM PT RTRN 9FT ADLT (ELECTROSURGICAL) ×1 IMPLANT
GLOVE BIO SURGEON STRL SZ 6.5 (GLOVE) ×2 IMPLANT
GLOVE BIO SURGEON STRL SZ7.5 (GLOVE) ×7 IMPLANT
GLOVE BIO SURGEONS STRL SZ 6.5 (GLOVE) ×1
GLOVE BIOGEL PI IND STRL 7.0 (GLOVE) IMPLANT
GLOVE BIOGEL PI INDICATOR 7.0 (GLOVE) ×2
GOWN STRL REUS W/ TWL LRG LVL3 (GOWN DISPOSABLE) ×3 IMPLANT
GOWN STRL REUS W/TWL LRG LVL3 (GOWN DISPOSABLE) ×9
KIT RM TURNOVER STRD PROC AR (KITS) ×3 IMPLANT
LABEL OR SOLS (LABEL) ×1 IMPLANT
LIQUID BAND (GAUZE/BANDAGES/DRESSINGS) ×3 IMPLANT
MESH SYNTHETIC 4X6 SOFT BARD (Mesh General) ×1 IMPLANT
MESH SYNTHETIC SOFT BARD 4X6 (Mesh General) ×2 IMPLANT
NDL HYPO 25X1 1.5 SAFETY (NEEDLE) ×1 IMPLANT
NEEDLE HYPO 25X1 1.5 SAFETY (NEEDLE) ×3 IMPLANT
NS IRRIG 500ML POUR BTL (IV SOLUTION) ×3 IMPLANT
PACK BASIN MINOR ARMC (MISCELLANEOUS) ×3 IMPLANT
SUT CHROMIC 4 0 RB 1X27 (SUTURE) ×1 IMPLANT
SUT MNCRL AB 4-0 PS2 18 (SUTURE) ×3 IMPLANT
SUT SURGILON 0 30 BLK (SUTURE) ×9 IMPLANT
SUT VIC AB 4-0 SH 27 (SUTURE) ×6
SUT VIC AB 4-0 SH 27XANBCTRL (SUTURE) ×2 IMPLANT
SYRINGE 10CC LL (SYRINGE) ×3 IMPLANT

## 2016-01-24 NOTE — Op Note (Signed)
OPERATIVE REPORT  PREOPERATIVE DIAGNOSIS: left inguinal hernia  POSTOPERATIVE DIAGNOSIS:left  inguinal hernia  PROCEDURE:  left inguinal hernia repair  ANESTHESIA:  General  SURGEON:  Rochel Brome M.D.  INDICATIONS: He has a long history of left inguinal hernia. Recently this has become painful and limiting his activities. A left inguinal hernia was demonstrated on physical exam and repair was recommended for definitive treatment.   With the patient on the operating table in the supine position the left lower quadrant was prepared with clippers and with ChloraPrep and draped in a sterile manner. A transversely oriented suprapubic incision was made and carried down through subcutaneous tissues. Electrocautery was used for hemostasis. The Scarpa's fascia was incised. The external oblique aponeurosis was incised along the course of its fibers to open the external ring and expose the inguinal cord structures. The cord structures were mobilized. A Penrose drain was passed around the cord structures for traction. Cremaster fibers were spread to expose an indirect hernia sac which was dissected free from surrounding structures and was fatty and cylindrical an approximate 2.5 cm in diameter and 7 cm in length and appeared to be all fatty tissue. There was also a direct hernia defect with a similar protuberance of fatty tissue cylindrical and some 2.5 cm in diameter which was approximated 7 cm in length. The attenuated transversalis fascia was incised circumferentially and removed. The sac was inverted. The direct hernia defect was approximately 1.8 cm in dimension and was closed with interrupted 0 Surgilon. The indirect sac was inverted. Additional repair was carried out suturing the conjoined tendon to the shelving edge of the inguinal ligament incorporating transversalis fascia into the repair. The last stitch led to satisfactory narrowing of the internal ring. There was some tension on the repair and a  relaxing incision was made medially and the internal oblique fascia. Bard soft mesh was cut to create an oval shape of 3 x 6 cm. A notch was cut out to straddle the internal ring. This was sutured to the repair and also to the medial border of the relaxing incision and also sutured to the fascia on both sides of the internal ring. Hemostasis was intact. Cord structures were replaced along the floor of the inguinal canal.  The cut edges of the external oblique aponeurosis were closed with a running 4-0 Vicryl suture to re-create the external ring. The deep fascia superior and lateral to the repair site was infiltrated with half percent Sensorcaine with epinephrine. Subcutaneous tissues were also infiltrated. The Scarpa's fascia was closed with interrupted 4-0 Vicryl sutures. The skin was closed with running 4-0 Monocryl subcuticular suture and LiquiBand. The testicle remained in the scrotum  The patient appeared to be in satisfactory condition and was prepared for transfer to the recovery room.  Rochel Brome M.D.

## 2016-01-24 NOTE — Progress Notes (Signed)
Dr. Tamala Julian into see     Pain level 2-3 on discharge

## 2016-01-24 NOTE — Anesthesia Preprocedure Evaluation (Signed)
Anesthesia Evaluation  Patient identified by MRN, date of birth, ID band  Reviewed: Allergy & Precautions, NPO status , Patient's Chart, lab work & pertinent test results  History of Anesthesia Complications History of anesthetic complications: slow return of bowel function after back surgery.  Airway Mallampati: II  TM Distance: >3 FB Neck ROM: Full    Dental  (+) Upper Dentures, Lower Dentures   Pulmonary Sleep apnea: s/p surgery. , former smoker,    breath sounds clear to auscultation- rhonchi (-) wheezing      Cardiovascular Exercise Tolerance: Good hypertension, Pt. on medications (-) angina(-) CAD and (-) Past MI  Rhythm:Regular Rate:Normal - Systolic murmurs and - Diastolic murmurs    Neuro/Psych negative psych ROS   GI/Hepatic Neg liver ROS, GERD  Controlled,  Endo/Other  negative endocrine ROSneg diabetes  Renal/GU negative Renal ROS     Musculoskeletal negative musculoskeletal ROS (+)   Abdominal (+) + obese,   Peds  Hematology negative hematology ROS (+)   Anesthesia Other Findings Past Medical History: No date: Cancer ([place])     Comment: skin cancer on the nose No date: Complication of anesthesia     Comment: could not get bowels to move No date: GERD (gastroesophageal reflux disease) No date: HLD (hyperlipidemia) No date: Hypertension No date: Macular degeneration No date: Sleep apnea   Reproductive/Obstetrics                             Anesthesia Physical Anesthesia Plan  ASA: III  Anesthesia Plan: General   Post-op Pain Management:    Induction: Intravenous  Airway Management Planned: Oral ETT  Additional Equipment:   Intra-op Plan:   Post-operative Plan:   Informed Consent: I have reviewed the patients History and Physical, chart, labs and discussed the procedure including the risks, benefits and alternatives for the proposed anesthesia with the patient or  authorized representative who has indicated his/her understanding and acceptance.   Dental advisory given  Plan Discussed with: CRNA and Anesthesiologist  Anesthesia Plan Comments:         Anesthesia Quick Evaluation

## 2016-01-24 NOTE — Anesthesia Procedure Notes (Signed)
Procedure Name: Intubation Date/Time: 01/24/2016 10:34 AM Performed by: Justus Memory Pre-anesthesia Checklist: Patient identified, Emergency Drugs available, Suction available and Patient being monitored Patient Re-evaluated:Patient Re-evaluated prior to inductionOxygen Delivery Method: Circle system utilized Preoxygenation: Pre-oxygenation with 100% oxygen Intubation Type: IV induction Ventilation: Mask ventilation with difficulty and Oral airway inserted - appropriate to patient size Laryngoscope Size: Mac and 3 Grade View: Grade I Tube type: Oral Tube size: 7.0 mm Number of attempts: 1 Airway Equipment and Method: Patient positioned with wedge pillow and Stylet Placement Confirmation: ETT inserted through vocal cords under direct vision,  positive ETCO2 and breath sounds checked- equal and bilateral Secured at: 21 (gum) cm Tube secured with: Tape Dental Injury: Teeth and Oropharynx as per pre-operative assessment

## 2016-01-24 NOTE — Anesthesia Postprocedure Evaluation (Signed)
Anesthesia Post Note  Patient: Jesse Meadows  Procedure(s) Performed: Procedure(s) (LRB): HERNIA REPAIR INGUINAL ADULT (Left)  Patient location during evaluation: PACU Anesthesia Type: General Level of consciousness: awake and alert and oriented Pain management: pain level controlled Vital Signs Assessment: post-procedure vital signs reviewed and stable Respiratory status: spontaneous breathing, nonlabored ventilation and respiratory function stable Cardiovascular status: blood pressure returned to baseline and stable Postop Assessment: no signs of nausea or vomiting Anesthetic complications: no    Last Vitals:  Vitals:   01/24/16 1225 01/24/16 1240  BP: (!) 162/80 (!) 148/68  Pulse: 63 (!) 56  Resp:    Temp: 36.8 C     Last Pain:  Vitals:   01/24/16 0943  TempSrc: Tympanic                  

## 2016-01-24 NOTE — H&P (Signed)
  He reports no change in condition since the day of the office visit. He does have a history of left groin pain and findings of left inguinal hernia.  I reviewed his labwork.  I discussed the plan for left inguinal hernia repair. The left side was marked YES.

## 2016-01-24 NOTE — Discharge Instructions (Addendum)
AMBULATORY SURGERY  DISCHARGE INSTRUCTIONS   1) The drugs that you were given will stay in your system until tomorrow so for the next 24 hours you should not:  A) Drive an automobile B) Make any legal decisions C) Drink any alcoholic beverage   2) You may resume regular meals tomorrow.  Today it is better to start with liquids and gradually work up to solid foods.  You may eat anything you prefer, but it is better to start with liquids, then soup and crackers, and gradually work up to solid foods.   3) Please notify your doctor immediately if you have any unusual bleeding, trouble breathing, redness and pain at the surgery site, drainage, fever, or pain not relieved by medication.    4) Additional Instructions:        Please contact your physician with any problems or Same Day Surgery at [phone], Monday through Friday 6 am to 4 pm, or [place] at [facility] number at [phone].AMBULATORY SURGERY  DISCHARGE INSTRUCTIONS   5) The drugs that you were given will stay in your system until tomorrow so for the next 24 hours you should not:  D) Drive an automobile E) Make any legal decisions F) Drink any alcoholic beverage   6) You may resume regular meals tomorrow.  Today it is better to start with liquids and gradually work up to solid foods.  You may eat anything you prefer, but it is better to start with liquids, then soup and crackers, and gradually work up to solid foods.   7) Please notify your doctor immediately if you have any unusual bleeding, trouble breathing, redness and pain at the surgery site, drainage, fever, or pain not relieved by medication.    8) Additional Instructions:        Please contact your physician with any problems or Same Day Surgery at [phone], Monday through Friday 6 am to 4 pm, or [place] at [facility] number at [phone].Take Tylenol or Norco if needed for pain.  May shower and blot dry.  Avoid  straining and heavy lifting.

## 2016-01-24 NOTE — Transfer of Care (Signed)
Immediate Anesthesia Transfer of Care Note  Patient: Jesse Meadows  Procedure(s) Performed: Procedure(s): HERNIA REPAIR INGUINAL ADULT (Left)  Patient Location: PACU  Anesthesia Type:General  Level of Consciousness: sedated  Airway & Oxygen Therapy: Patient Spontanous Breathing and Patient connected to face mask oxygen  Post-op Assessment: Report given to RN and Post -op Vital signs reviewed and stable  Post vital signs: Reviewed and stable  Last Vitals:  Vitals:   01/24/16 0943 01/24/16 1225  BP: (!) 168/80 (!) 162/80  Pulse: 61 63  Resp: 16   Temp: 36.4 C     Last Pain:  Vitals:   01/24/16 0943  TempSrc: Tympanic         Complications: No apparent anesthesia complications

## 2016-01-27 ENCOUNTER — Encounter: Payer: Self-pay | Admitting: Surgery

## 2016-02-07 ENCOUNTER — Encounter: Payer: Self-pay | Admitting: Surgery

## 2016-03-19 DIAGNOSIS — Z85828 Personal history of other malignant neoplasm of skin: Secondary | ICD-10-CM | POA: Diagnosis not present

## 2016-03-19 DIAGNOSIS — D485 Neoplasm of uncertain behavior of skin: Secondary | ICD-10-CM | POA: Diagnosis not present

## 2016-03-19 DIAGNOSIS — L570 Actinic keratosis: Secondary | ICD-10-CM | POA: Diagnosis not present

## 2016-03-19 DIAGNOSIS — X32XXXA Exposure to sunlight, initial encounter: Secondary | ICD-10-CM | POA: Diagnosis not present

## 2016-03-19 DIAGNOSIS — C44219 Basal cell carcinoma of skin of left ear and external auricular canal: Secondary | ICD-10-CM | POA: Diagnosis not present

## 2016-04-07 DIAGNOSIS — I10 Essential (primary) hypertension: Secondary | ICD-10-CM | POA: Diagnosis not present

## 2016-04-07 DIAGNOSIS — E782 Mixed hyperlipidemia: Secondary | ICD-10-CM | POA: Diagnosis not present

## 2016-04-07 DIAGNOSIS — F4321 Adjustment disorder with depressed mood: Secondary | ICD-10-CM | POA: Diagnosis not present

## 2016-04-07 DIAGNOSIS — K4090 Unilateral inguinal hernia, without obstruction or gangrene, not specified as recurrent: Secondary | ICD-10-CM | POA: Diagnosis not present

## 2016-04-07 DIAGNOSIS — E7800 Pure hypercholesterolemia, unspecified: Secondary | ICD-10-CM | POA: Diagnosis not present

## 2016-04-07 DIAGNOSIS — F5105 Insomnia due to other mental disorder: Secondary | ICD-10-CM | POA: Diagnosis not present

## 2016-04-07 DIAGNOSIS — M150 Primary generalized (osteo)arthritis: Secondary | ICD-10-CM | POA: Diagnosis not present

## 2016-04-08 DIAGNOSIS — K4090 Unilateral inguinal hernia, without obstruction or gangrene, not specified as recurrent: Secondary | ICD-10-CM | POA: Diagnosis not present

## 2016-04-08 DIAGNOSIS — I10 Essential (primary) hypertension: Secondary | ICD-10-CM | POA: Diagnosis not present

## 2016-04-08 DIAGNOSIS — E7800 Pure hypercholesterolemia, unspecified: Secondary | ICD-10-CM | POA: Diagnosis not present

## 2016-04-08 DIAGNOSIS — F4321 Adjustment disorder with depressed mood: Secondary | ICD-10-CM | POA: Diagnosis not present

## 2016-04-08 DIAGNOSIS — E782 Mixed hyperlipidemia: Secondary | ICD-10-CM | POA: Diagnosis not present

## 2016-04-08 DIAGNOSIS — M150 Primary generalized (osteo)arthritis: Secondary | ICD-10-CM | POA: Diagnosis not present

## 2016-04-08 DIAGNOSIS — F5105 Insomnia due to other mental disorder: Secondary | ICD-10-CM | POA: Diagnosis not present

## 2016-04-14 DIAGNOSIS — M5441 Lumbago with sciatica, right side: Secondary | ICD-10-CM | POA: Diagnosis not present

## 2016-04-14 DIAGNOSIS — D696 Thrombocytopenia, unspecified: Secondary | ICD-10-CM | POA: Insufficient documentation

## 2016-04-14 DIAGNOSIS — Z0000 Encounter for general adult medical examination without abnormal findings: Secondary | ICD-10-CM | POA: Diagnosis not present

## 2016-04-14 DIAGNOSIS — Z23 Encounter for immunization: Secondary | ICD-10-CM | POA: Diagnosis not present

## 2016-04-14 DIAGNOSIS — M150 Primary generalized (osteo)arthritis: Secondary | ICD-10-CM | POA: Diagnosis not present

## 2016-04-14 DIAGNOSIS — I10 Essential (primary) hypertension: Secondary | ICD-10-CM | POA: Diagnosis not present

## 2016-04-14 DIAGNOSIS — R972 Elevated prostate specific antigen [PSA]: Secondary | ICD-10-CM | POA: Diagnosis not present

## 2016-04-14 DIAGNOSIS — E7800 Pure hypercholesterolemia, unspecified: Secondary | ICD-10-CM | POA: Diagnosis not present

## 2016-04-14 DIAGNOSIS — G8929 Other chronic pain: Secondary | ICD-10-CM | POA: Diagnosis not present

## 2016-04-14 DIAGNOSIS — F331 Major depressive disorder, recurrent, moderate: Secondary | ICD-10-CM | POA: Diagnosis not present

## 2016-04-28 DIAGNOSIS — C44219 Basal cell carcinoma of skin of left ear and external auricular canal: Secondary | ICD-10-CM | POA: Diagnosis not present

## 2016-04-28 DIAGNOSIS — Z85828 Personal history of other malignant neoplasm of skin: Secondary | ICD-10-CM | POA: Diagnosis not present

## 2016-08-05 DIAGNOSIS — Z23 Encounter for immunization: Secondary | ICD-10-CM | POA: Diagnosis not present

## 2016-08-05 DIAGNOSIS — I10 Essential (primary) hypertension: Secondary | ICD-10-CM | POA: Diagnosis not present

## 2016-08-05 DIAGNOSIS — Z125 Encounter for screening for malignant neoplasm of prostate: Secondary | ICD-10-CM | POA: Diagnosis not present

## 2016-08-05 DIAGNOSIS — Z0000 Encounter for general adult medical examination without abnormal findings: Secondary | ICD-10-CM | POA: Diagnosis not present

## 2016-08-05 DIAGNOSIS — R972 Elevated prostate specific antigen [PSA]: Secondary | ICD-10-CM | POA: Diagnosis not present

## 2016-08-05 DIAGNOSIS — E7800 Pure hypercholesterolemia, unspecified: Secondary | ICD-10-CM | POA: Diagnosis not present

## 2016-08-05 DIAGNOSIS — M150 Primary generalized (osteo)arthritis: Secondary | ICD-10-CM | POA: Diagnosis not present

## 2016-08-05 DIAGNOSIS — G8929 Other chronic pain: Secondary | ICD-10-CM | POA: Diagnosis not present

## 2016-08-05 DIAGNOSIS — M5441 Lumbago with sciatica, right side: Secondary | ICD-10-CM | POA: Diagnosis not present

## 2016-08-12 DIAGNOSIS — G4733 Obstructive sleep apnea (adult) (pediatric): Secondary | ICD-10-CM | POA: Diagnosis not present

## 2016-08-12 DIAGNOSIS — F329 Major depressive disorder, single episode, unspecified: Secondary | ICD-10-CM | POA: Diagnosis not present

## 2016-08-12 DIAGNOSIS — E7800 Pure hypercholesterolemia, unspecified: Secondary | ICD-10-CM | POA: Diagnosis not present

## 2016-08-12 DIAGNOSIS — H6692 Otitis media, unspecified, left ear: Secondary | ICD-10-CM | POA: Diagnosis not present

## 2016-08-12 DIAGNOSIS — M150 Primary generalized (osteo)arthritis: Secondary | ICD-10-CM | POA: Diagnosis not present

## 2016-08-12 DIAGNOSIS — D696 Thrombocytopenia, unspecified: Secondary | ICD-10-CM | POA: Diagnosis not present

## 2016-08-12 DIAGNOSIS — I10 Essential (primary) hypertension: Secondary | ICD-10-CM | POA: Diagnosis not present

## 2016-08-12 DIAGNOSIS — R262 Difficulty in walking, not elsewhere classified: Secondary | ICD-10-CM | POA: Diagnosis not present

## 2016-08-12 DIAGNOSIS — R972 Elevated prostate specific antigen [PSA]: Secondary | ICD-10-CM | POA: Diagnosis not present

## 2016-08-12 DIAGNOSIS — Z0000 Encounter for general adult medical examination without abnormal findings: Secondary | ICD-10-CM | POA: Diagnosis not present

## 2016-08-23 ENCOUNTER — Emergency Department: Payer: PPO

## 2016-08-23 ENCOUNTER — Emergency Department
Admission: EM | Admit: 2016-08-23 | Discharge: 2016-08-23 | Disposition: A | Discharge status: Home / Self Care | Payer: PPO | Attending: Emergency Medicine | Admitting: Emergency Medicine

## 2016-08-23 DIAGNOSIS — Z87891 Personal history of nicotine dependence: Secondary | ICD-10-CM | POA: Insufficient documentation

## 2016-08-23 DIAGNOSIS — R42 Dizziness and giddiness: Secondary | ICD-10-CM | POA: Diagnosis not present

## 2016-08-23 DIAGNOSIS — Z79899 Other long term (current) drug therapy: Secondary | ICD-10-CM | POA: Insufficient documentation

## 2016-08-23 DIAGNOSIS — L304 Erythema intertrigo: Secondary | ICD-10-CM | POA: Diagnosis not present

## 2016-08-23 DIAGNOSIS — I10 Essential (primary) hypertension: Secondary | ICD-10-CM | POA: Insufficient documentation

## 2016-08-23 LAB — CBC
HEMATOCRIT: 45.7 % (ref 40.0–52.0)
Hemoglobin: 16.2 g/dL (ref 13.0–18.0)
MCH: 32.3 pg (ref 26.0–34.0)
MCHC: 35.6 g/dL (ref 32.0–36.0)
MCV: 90.7 fL (ref 80.0–100.0)
PLATELETS: 141 10*3/uL — AB (ref 150–440)
RBC: 5.03 MIL/uL (ref 4.40–5.90)
RDW: 13.9 % (ref 11.5–14.5)
WBC: 6.7 10*3/uL (ref 3.8–10.6)

## 2016-08-23 LAB — BASIC METABOLIC PANEL
Anion gap: 4 — ABNORMAL LOW (ref 5–15)
BUN: 22 mg/dL — AB (ref 6–20)
CO2: 32 mmol/L (ref 22–32)
CREATININE: 1.02 mg/dL (ref 0.61–1.24)
Calcium: 9.2 mg/dL (ref 8.9–10.3)
Chloride: 104 mmol/L (ref 101–111)
GFR calc Af Amer: >60 mL/min (ref >60)
GLUCOSE: 97 mg/dL (ref 65–99)
Potassium: 4.4 mmol/L (ref 3.5–5.1)
SODIUM: 140 mmol/L (ref 135–145)

## 2016-08-23 LAB — TROPONIN I: Troponin I: <0.03 ng/mL (ref <0.03)

## 2016-08-23 MED ORDER — MECLIZINE HCL 25 MG PO TABS: 25.0000 mg | ORAL_TABLET | Freq: Three times a day (TID) | ORAL | 0 refills | Status: DC | PRN

## 2016-08-23 MED ORDER — MECLIZINE HCL 25 MG PO TABS
25.0000 mg | ORAL_TABLET | Freq: Once | ORAL | Status: AC
  Administered 2016-08-23: 25 mg via ORAL
  Filled 2016-08-23: qty 1

## 2016-08-23 MED ORDER — ONDANSETRON 4 MG PO TBDP
4.0000 mg | ORAL_TABLET | Freq: Once | ORAL | Status: AC
  Administered 2016-08-23: 4 mg via ORAL
  Filled 2016-08-23: qty 1

## 2016-08-23 MED ORDER — ONDANSETRON HCL 4 MG PO TABS: 4.0000 mg | ORAL_TABLET | Freq: Three times a day (TID) | ORAL | 0 refills | Status: DC | PRN

## 2016-08-23 MED ORDER — NYSTATIN 100000 UNIT/GM EX POWD: Freq: Three times a day (TID) | CUTANEOUS | 0 refills | Status: DC

## 2016-08-23 NOTE — ED Provider Notes (Signed)
[facility] Emergency Department Provider Note ____________________________________________   I have reviewed the triage vital signs and the triage nursing note.  HISTORY  Chief Complaint Dizziness   Historian Patient  HPI Jesse Meadows is a 83 y.o. male presenting today for evaluation of ringing in the ears, left greater than right. He also states that he feels like he is lightheaded and dizzy. When asked him to describe the dizziness, he states that although he has macular degeneration and is legally blind, he does experience movement or spinning. He states he thought maybe it was vertigo. He states that he saw his primary doctor a couple days ago and was told he had ear infections and was placed on antibiotic. He has not had sinus congestion or sore throat. Slight fevers. He states his brother had ringing in his ears before he was diagnosed with a head bleed. This is what is most concerned about today. No chest pain or palpitations or passing out. No focal weakness or numbness. Mild head pressure withoutheadache.    Past Medical History:  Diagnosis Date  . Cancer ([place])    skin cancer on the nose  . Complication of anesthesia    could not get bowels to move  . GERD (gastroesophageal reflux disease)   . HLD (hyperlipidemia)   . Hypertension   . Macular degeneration   . Sleep apnea     Patient Active Problem List   Diagnosis Date Noted  . DEHYDRATION 08/27/2010  . LABRYNTHITIS 08/27/2010  . INSOMNIA UNSPECIFIED 08/27/2010  . UNSPECIFIED ESSENTIAL HYPERTENSION 08/14/2010  . ALLERGIC RHINITIS CAUSE UNSPECIFIED 08/14/2010  . SCIATICA, RIGHT 08/14/2010    Past Surgical History:  Procedure Laterality Date  . BACK SURGERY    . CATARACT EXTRACTION, BILATERAL    . INGUINAL HERNIA REPAIR Left 01/24/2016   Procedure: HERNIA REPAIR INGUINAL ADULT;  Surgeon: Leonie Green, MD;  Location: [facility] ORS;  Service: General;  Laterality: Left;  .  UVULOPALATOPHARYNGOPLASTY      Prior to Admission medications   Medication Sig Start Date End Date Taking? Authorizing Provider  HYDROcodone-acetaminophen (NORCO) 5-325 MG tablet Take 1-2 tablets by mouth every 4 (four) hours as needed for moderate pain. 01/24/16   Leonie Green, MD  latanoprost (XALATAN) 0.005 % ophthalmic solution Place 1 drop into both eyes at bedtime.    Historical Provider, MD  lisinopril (PRINIVIL,ZESTRIL) 40 MG tablet Take 40 mg by mouth at bedtime.    Historical Provider, MD  meclizine (ANTIVERT) 25 MG tablet Take 1 tablet (25 mg total) by mouth 3 (three) times daily as needed for dizziness or nausea. 08/23/16   Lisa Roca, MD  Multiple Vitamins-Minerals (PRESERVISION AREDS PO) Take 1 tablet by mouth daily.    Historical Provider, MD  nystatin (MYCOSTATIN/NYSTOP) powder Apply topically 3 (three) times daily. 08/23/16   Lisa Roca, MD  ondansetron (ZOFRAN) 4 MG tablet Take 1 tablet (4 mg total) by mouth every 8 (eight) hours as needed for nausea or vomiting. 08/23/16   Lisa Roca, MD  simvastatin (ZOCOR) 40 MG tablet Take 40 mg by mouth every other day.    Historical Provider, MD  traZODone (DESYREL) 50 MG tablet Take 50 mg by mouth at bedtime.    Historical Provider, MD    Allergies  Allergen Reactions  . Penicillins Rash    No family history on file.  Social History Social History  Substance Use Topics  . Smoking status: Former Research scientist (life sciences)  . Smokeless tobacco: Former Systems developer  .  Alcohol use No    Review of Systems   Constitutional: Negative for fever. Eyes: Legally blind, states he may be feeling some worsening blurry vision for the last 2-3 days with the ringing in the ears. ENT: Negative for sore throat. Cardiovascular: Negative for chest pain. Respiratory: Negative for shortness of breath. Gastrointestinal: Negative for abdominal pain, vomiting and diarrhea. Genitourinary: Negative for dysuria. Musculoskeletal: Negative for back pain. Skin:  Negative for rash. Neurological: Negative for headache.  Some mild head pressure. 10 point Review of Systems otherwise negative ____________________________________________   PHYSICAL EXAM:  VITAL SIGNS: ED Triage Vitals  Enc Vitals Group     BP 08/23/16 1650 (!) 208/75     Pulse Rate 08/23/16 1650 60     Resp 08/23/16 1650 18     Temp 08/23/16 1650 97.8 F (36.6 C)     Temp src --      SpO2 08/23/16 1650 95 %     Weight --      Height 08/23/16 1650 5\' 11"  (1.803 m)     Head Circumference --      Peak Flow --      Pain Score 08/23/16 1851 4     Pain Loc --      Pain Edu? --      Excl. in [place]? --      Constitutional: Alert and oriented. Well appearing and in no distress. HEENT   Head: Normocephalic and atraumatic.      Eyes: Conjunctivae are normal. PERRL. Normal extraocular movements.      Ears:   Right TM clear and normal in appearance with normal light reflex. Left TM portion that was visible around the wax was normal in appearance.      Nose: No congestion/rhinnorhea.   Mouth/Throat: Mucous membranes are moist.   Neck: No stridor. Cardiovascular/Chest: Normal rate, regular rhythm.  No murmurs, rubs, or gallops. Respiratory: Normal respiratory effort without tachypnea nor retractions. Breath sounds are clear and equal bilaterally. No wheezes/rales/rhonchi. Gastrointestinal: Soft. No distention, no guarding, no rebound. Nontender.    Genitourinary/rectal:Deferred Musculoskeletal: Nontender with normal range of motion in all extremities. No joint effusions.  No lower extremity tenderness.  No edema. Neurologic:  No facial droop. No aphasia. Normal speech and language. Grossly decreased vision. No focal weakness or numbness in the extremities. Skin:  Skin is warm, dry and intact. No rash noted. Psychiatric: Mood and affect are normal. Speech and behavior are normal. Patient exhibits appropriate insight and  judgment.   ____________________________________________  LABS (pertinent positives/negatives)  Labs Reviewed  BASIC METABOLIC PANEL - Abnormal; Notable for the following:       Result Value   BUN 22 (*)    Anion gap 4 (*)    All other components within normal limits  CBC - Abnormal; Notable for the following:    Platelets 141 (*)    All other components within normal limits  TROPONIN I  CBG MONITORING, ED    ____________________________________________    EKG I, Lisa Roca, MD, the attending physician have personally viewed and interpreted all ECGs.  58 bpm. Sinus bradycardia. Nonspecific intraventricular conduction delay. Nonspecific ST and T-wave ____________________________________________  RADIOLOGY All Xrays were viewed by me. Imaging interpreted by Radiologist.  Ct head without contrast:  IMPRESSION: 1. No acute intracranial abnormality. 2. Atrophy with chronic small vessel white matter ischemic disease. __________________________________________  PROCEDURES  Procedure(s) performed: None  Critical Care performed: None  ____________________________________________   ED COURSE / ASSESSMENT AND PLAN  Pertinent labs & imaging results that were available during my care of the patient were reviewed by me and considered in my medical decision making (see chart for details).   Mr. Miles is here for persistent dizziness which sounds like it has some component of vertigo, however given family history of head bleed, mild head pressure, and questionable change in vision from baseline, I did discuss obtaining head CT.  CT head without acute finding, acute bleeding or ischemic stroke. His symptoms have been ongoing now for over 2 days, and so I think that if this was an ischemic stroke we would likely has seen at this point.  He was treated symptomatically with meclizine and Zofran for what I suspect is vertigo.  No urinary symptoms. No cardiac symptoms. I  think he was most concerned to make sure that there was no head bleeding.   Patiently for discharge home today. I recommended follow with his primary care doctor as well as ENT if he has persistent symptoms.   CONSULTATIONS:   None  Patient / Family / Caregiver informed of clinical course, medical decision-making process, and agree with plan.   I discussed return precautions, follow-up instructions, and discharge instructions with patient and/or family.  Discharge instructions:  You were evaluated for dizziness, and as we discussed her exam and evaluation are reassuring in the emergency department stay. I'm suspicious that your symptoms are due to vertigo. Your prescribed both meclizine and Zofran to use as needed for vertigo symptoms. If your symptoms are not better within the next week, I would recommend seeing and ears nose throat specialist, call Dr. Darien Ramus office.  Please also follow up with your primary care doctor. Next line next line you are also been prescribed nystatin powder for the yeast infection called intertrigo.  Use until improved.  Return to the emergency department for any new or worsening symptoms of headache, confusion or altered mental status, weakness, numbness, chest pain, palpitations, vomiting, or any other symptoms concerning to you. ___________________________________________   FINAL CLINICAL IMPRESSION(S) / ED DIAGNOSES   Final diagnoses:  Intertrigo  Vertigo              Note: This dictation was prepared with Dragon dictation. Any transcriptional errors that result from this process are unintentional    Lisa Roca, MD 08/23/16 2037

## 2016-08-23 NOTE — ED Triage Notes (Addendum)
Pt states that he was seen by dr Ginette Pitman 2 weeks ago and given amoxicillin and ear drops for his ears, states roaring in his ears, states increased in urination since then and states that he developed dizziness on Thursday and reports almost falling. Pt states that he has been having some "deadening sensation" since he saw the dr on his left side

## 2016-08-23 NOTE — Discharge Instructions (Signed)
You were evaluated for dizziness, and as we discussed her exam and evaluation are reassuring in the emergency department stay. I'm suspicious that your symptoms are due to vertigo. Your prescribed both meclizine and Zofran to use as needed for vertigo symptoms. If your symptoms are not better within the next week, I would recommend seeing and ears nose throat specialist, call Dr. Darien Ramus office.  Please also follow up with your primary care doctor. Next line next line you are also been prescribed nystatin powder for the yeast infection called intertrigo.  Use until improved.  Return to the emergency department for any new or worsening symptoms of headache, confusion or altered mental status, weakness, numbness, chest pain, palpitations, vomiting, or any other symptoms concerning to you.

## 2016-08-25 DIAGNOSIS — L304 Erythema intertrigo: Secondary | ICD-10-CM | POA: Diagnosis not present

## 2016-08-25 DIAGNOSIS — K5900 Constipation, unspecified: Secondary | ICD-10-CM | POA: Diagnosis not present

## 2016-08-25 DIAGNOSIS — R42 Dizziness and giddiness: Secondary | ICD-10-CM | POA: Diagnosis not present

## 2016-08-25 DIAGNOSIS — M79604 Pain in right leg: Secondary | ICD-10-CM | POA: Diagnosis not present

## 2016-08-27 DIAGNOSIS — R410 Disorientation, unspecified: Secondary | ICD-10-CM | POA: Diagnosis not present

## 2016-08-27 DIAGNOSIS — R350 Frequency of micturition: Secondary | ICD-10-CM | POA: Diagnosis not present

## 2016-09-03 DIAGNOSIS — F99 Mental disorder, not otherwise specified: Secondary | ICD-10-CM | POA: Diagnosis not present

## 2016-09-03 DIAGNOSIS — R9082 White matter disease, unspecified: Secondary | ICD-10-CM | POA: Diagnosis not present

## 2016-09-03 DIAGNOSIS — R413 Other amnesia: Secondary | ICD-10-CM | POA: Diagnosis not present

## 2016-09-14 DIAGNOSIS — E538 Deficiency of other specified B group vitamins: Secondary | ICD-10-CM | POA: Diagnosis not present

## 2016-09-16 ENCOUNTER — Other Ambulatory Visit: Payer: Self-pay | Admitting: Neurology

## 2016-09-16 DIAGNOSIS — R413 Other amnesia: Secondary | ICD-10-CM

## 2016-09-17 DIAGNOSIS — Z85828 Personal history of other malignant neoplasm of skin: Secondary | ICD-10-CM | POA: Diagnosis not present

## 2016-09-17 DIAGNOSIS — X32XXXA Exposure to sunlight, initial encounter: Secondary | ICD-10-CM | POA: Diagnosis not present

## 2016-09-17 DIAGNOSIS — L570 Actinic keratosis: Secondary | ICD-10-CM | POA: Diagnosis not present

## 2016-09-17 DIAGNOSIS — Z08 Encounter for follow-up examination after completed treatment for malignant neoplasm: Secondary | ICD-10-CM | POA: Diagnosis not present

## 2016-09-17 DIAGNOSIS — L821 Other seborrheic keratosis: Secondary | ICD-10-CM | POA: Diagnosis not present

## 2016-09-24 ENCOUNTER — Ambulatory Visit
Admission: RE | Admit: 2016-09-24 | Discharge: 2016-09-24 | Disposition: A | Discharge status: Home / Self Care | Payer: PPO | Source: Ambulatory Visit | Attending: Neurology | Admitting: Neurology

## 2016-09-24 DIAGNOSIS — R413 Other amnesia: Secondary | ICD-10-CM | POA: Diagnosis not present

## 2016-09-24 DIAGNOSIS — F99 Mental disorder, not otherwise specified: Secondary | ICD-10-CM | POA: Diagnosis not present

## 2016-09-30 DIAGNOSIS — H353134 Nonexudative age-related macular degeneration, bilateral, advanced atrophic with subfoveal involvement: Secondary | ICD-10-CM | POA: Diagnosis not present

## 2016-09-30 DIAGNOSIS — H40053 Ocular hypertension, bilateral: Secondary | ICD-10-CM | POA: Diagnosis not present

## 2016-10-14 DIAGNOSIS — M25551 Pain in right hip: Secondary | ICD-10-CM | POA: Diagnosis not present

## 2016-10-14 DIAGNOSIS — M1611 Unilateral primary osteoarthritis, right hip: Secondary | ICD-10-CM | POA: Diagnosis not present

## 2016-10-22 ENCOUNTER — Encounter
Admission: RE | Admit: 2016-10-22 | Discharge: 2016-10-22 | Disposition: A | Discharge status: Home / Self Care | Payer: PPO | Source: Ambulatory Visit | Attending: Orthopedic Surgery | Admitting: Orthopedic Surgery

## 2016-10-22 DIAGNOSIS — G4700 Insomnia, unspecified: Secondary | ICD-10-CM | POA: Insufficient documentation

## 2016-10-22 DIAGNOSIS — M5430 Sciatica, unspecified side: Secondary | ICD-10-CM | POA: Insufficient documentation

## 2016-10-22 DIAGNOSIS — J309 Allergic rhinitis, unspecified: Secondary | ICD-10-CM | POA: Diagnosis not present

## 2016-10-22 DIAGNOSIS — E785 Hyperlipidemia, unspecified: Secondary | ICD-10-CM | POA: Diagnosis not present

## 2016-10-22 DIAGNOSIS — K219 Gastro-esophageal reflux disease without esophagitis: Secondary | ICD-10-CM | POA: Insufficient documentation

## 2016-10-22 DIAGNOSIS — Z01812 Encounter for preprocedural laboratory examination: Secondary | ICD-10-CM | POA: Insufficient documentation

## 2016-10-22 DIAGNOSIS — I10 Essential (primary) hypertension: Secondary | ICD-10-CM | POA: Diagnosis not present

## 2016-10-22 LAB — URINALYSIS, COMPLETE (UACMP) WITH MICROSCOPIC
BACTERIA UA: NONE SEEN
BILIRUBIN URINE: NEGATIVE
Glucose, UA: NEGATIVE mg/dL
Hgb urine dipstick: NEGATIVE
KETONES UR: NEGATIVE mg/dL
LEUKOCYTES UA: NEGATIVE
Nitrite: NEGATIVE
PH: 6 (ref 5.0–8.0)
Protein, ur: NEGATIVE mg/dL
RBC / HPF: NONE SEEN RBC/hpf (ref 0–5)
SPECIFIC GRAVITY, URINE: 1.019 (ref 1.005–1.030)
SQUAMOUS EPITHELIAL / LPF: NONE SEEN

## 2016-10-22 LAB — BASIC METABOLIC PANEL
Anion gap: 8 (ref 5–15)
BUN: 22 mg/dL — ABNORMAL HIGH (ref 6–20)
CALCIUM: 9.3 mg/dL (ref 8.9–10.3)
CHLORIDE: 99 mmol/L — AB (ref 101–111)
CO2: 33 mmol/L — AB (ref 22–32)
Creatinine, Ser: 0.96 mg/dL (ref 0.61–1.24)
GFR calc Af Amer: >60 mL/min (ref >60)
GLUCOSE: 148 mg/dL — AB (ref 65–99)
Potassium: 3.8 mmol/L (ref 3.5–5.1)
Sodium: 140 mmol/L (ref 135–145)

## 2016-10-22 LAB — CBC
HEMATOCRIT: 47.9 % (ref 40.0–52.0)
HEMOGLOBIN: 16.6 g/dL (ref 13.0–18.0)
MCH: 31.7 pg (ref 26.0–34.0)
MCHC: 34.6 g/dL (ref 32.0–36.0)
MCV: 91.7 fL (ref 80.0–100.0)
Platelets: 154 10*3/uL (ref 150–440)
RBC: 5.23 MIL/uL (ref 4.40–5.90)
RDW: 13.8 % (ref 11.5–14.5)
WBC: 7.2 10*3/uL (ref 3.8–10.6)

## 2016-10-22 LAB — APTT: aPTT: 31 seconds (ref 24–36)

## 2016-10-22 LAB — PROTIME-INR
INR: 0.98
Prothrombin Time: 13 seconds (ref 11.4–15.2)

## 2016-10-22 LAB — SURGICAL PCR SCREEN
MRSA, PCR: NEGATIVE
Staphylococcus aureus: NEGATIVE

## 2016-10-22 LAB — SEDIMENTATION RATE: Sed Rate: 3 mm/hr (ref 0–20)

## 2016-10-22 LAB — TYPE AND SCREEN
ABO/RH(D): B POS
Antibody Screen: NEGATIVE

## 2016-10-22 NOTE — Patient Instructions (Signed)
Your procedure is scheduled on: Nov 05, 2016 THURSDAY Su procedimiento est programado para: Report to SND FLOOR OF THE MEDICAL MALL COME THRU REVOLVING DOOR ENTRANCE. Presntese a: To find out your arrival time please call [phone] between 1PM - 3PM on Nov 05, [facility]. Para saber su hora de llegada por favor llame al [phone] entre la 1PM - 3PM el da:  Remember: Instructions that are not followed completely may result in serious medical risk, up to and including death, or upon the discretion of your surgeon and anesthesiologist your surgery may need to be rescheduled.  Recuerde: Las instrucciones que no se siguen completamente Heritage manager en un riesgo de salud grave, incluyendo hasta la [place] o a discrecin de su cirujano y Environmental health practitioner, su ciruga se puede posponer.   __X__ 1. Do not eat food or drink liquids after midnight. No gum chewing or hard candies.  No coma alimentos ni tome lquidos despus de la medianoche.  No mastique chicle ni caramelos  duros.     __X__ 2. No alcohol for 24 hours before or after surgery.    No tome alcohol durante las 24 horas antes ni despus de la [place].   __X__ 3. Bring all medications with you on the day of surgery if instructed. BRING ANY NEW MEDICATIONS    Lleve todos los medicamentos con usted el da de su ciruga si se le ha indicado as.   __X__ 4. Notify your doctor if there is any change in your medical condition (cold, fever,                             infections).    Informe a su mdico si hay algn cambio en su condicin mdica (resfriado, fiebre, infecciones).   Do not wear jewelry, make-up, hairpins, clips or nail polish.  No use joyas, maquillajes, pinzas/ganchos para el cabello ni esmalte de uas.  Do not wear lotions, powders, or perfumes. You may NOT wear deodorant.  No use lociones, polvos o perfumes.  Puede usar desodorante.    Do not shave 48 hours prior to surgery. Men may shave face and neck.  No se  afeite 48 horas antes de la [place].  Los hombres pueden [employer] cara y el cuello.   Do not bring valuables to the hospital.   No lleve objetos [place] is not responsible for any belongings or valuables.  [place] no se hace responsable de ningn tipo de pertenencias u objetos de Geographical information systems officer.               Contacts, dentures or bridgework may not be worn into surgery.  Los lentes de [place], las dentaduras postizas o puentes no se pueden usar en la [place].  Leave your suitcase in the car. After surgery it may be brought to your room.  Deje su maleta en el auto.  Despus de la ciruga podr traerla a su habitacin.  For patients admitted to the hospital, discharge time is determined by your treatment team.  Para los pacientes que sean ingresados al hospital, el tiempo en el cual se le dar de alta es determinado por su                equipo de [place].   Patients discharged the day of surgery will not be allowed to drive home. A los pacientes que se les da de alta el mismo da de la  ciruga no se les permitir conducir a casa.   Please read over the following fact sheets that you were given: Por favor [place] informacin que le dieron:   CHG SOAP INSTRUCTIONS MRSA INFO   _X___ Take these medicines the morning of surgery with A SIP OF WATER:          [employer] medicinas la maana de la ciruga con UN SORBO DE AGUA:  1. LISINOPRIL  2. SIMVASTATIN  3.   4.       5.  6.  ____ Fleet Enema (as directed)          Enema de Fleet (segn lo indicado)    __X__ Use CHG Soap as directed          Utilice el jabn de CHG segn lo indicado  ____ Use inhalers on the day of surgery          Use los inhaladores el da de la ciruga  ____ Stop metformin 2 days prior to surgery          Deje de tomar el metformin 2 das antes de la ciruga    ____ Take 1/2 of usual insulin dose the night before surgery and none on the morning of surgery            Tome la mitad de la dosis habitual de insulina la noche antes de la [place] y no tome nada en la maana de la             ciruga  ____ Stop Coumadin/Plavix/aspirin on          Deje de tomar el Coumadin/Plavix/aspirina el da:  __X__ Stop Anti-inflammatories on IBUPROFEN, ADVIL, MOTRIN, ALEVE, ANAPROX, GOODY'S POWDER           Deje de tomar antiinflamatorios el da:   ____ Stop supplements until after surgery            Deje de tomar suplementos hasta despus de la ciruga  ____ Bring C-Pap to the hospital          [place] al hospital

## 2016-10-24 LAB — URINE CULTURE

## 2016-10-26 NOTE — Pre-Procedure Instructions (Addendum)
uc called to casey at dr Rudene Christians and faxed

## 2016-11-02 DIAGNOSIS — M1610 Unilateral primary osteoarthritis, unspecified hip: Secondary | ICD-10-CM | POA: Diagnosis not present

## 2016-11-02 DIAGNOSIS — J069 Acute upper respiratory infection, unspecified: Secondary | ICD-10-CM | POA: Diagnosis not present

## 2016-11-02 DIAGNOSIS — W57XXXA Bitten or stung by nonvenomous insect and other nonvenomous arthropods, initial encounter: Secondary | ICD-10-CM | POA: Diagnosis not present

## 2016-11-03 ENCOUNTER — Inpatient Hospital Stay
Admission: EM | Admit: 2016-11-03 | Discharge: 2016-11-06 | DRG: 871 | Disposition: A | Discharge status: Home / Self Care | Payer: PPO | Attending: Internal Medicine | Admitting: Internal Medicine

## 2016-11-03 ENCOUNTER — Emergency Department: Payer: PPO

## 2016-11-03 DIAGNOSIS — Z85828 Personal history of other malignant neoplasm of skin: Secondary | ICD-10-CM | POA: Diagnosis not present

## 2016-11-03 DIAGNOSIS — G4730 Sleep apnea, unspecified: Secondary | ICD-10-CM | POA: Diagnosis not present

## 2016-11-03 DIAGNOSIS — R0602 Shortness of breath: Secondary | ICD-10-CM | POA: Diagnosis not present

## 2016-11-03 DIAGNOSIS — Z880 Allergy status to penicillin: Secondary | ICD-10-CM | POA: Diagnosis not present

## 2016-11-03 DIAGNOSIS — K219 Gastro-esophageal reflux disease without esophagitis: Secondary | ICD-10-CM | POA: Diagnosis not present

## 2016-11-03 DIAGNOSIS — R21 Rash and other nonspecific skin eruption: Secondary | ICD-10-CM | POA: Diagnosis not present

## 2016-11-03 DIAGNOSIS — L8990 Pressure ulcer of unspecified site, unspecified stage: Secondary | ICD-10-CM | POA: Diagnosis not present

## 2016-11-03 DIAGNOSIS — Z79899 Other long term (current) drug therapy: Secondary | ICD-10-CM | POA: Diagnosis not present

## 2016-11-03 DIAGNOSIS — R0902 Hypoxemia: Secondary | ICD-10-CM

## 2016-11-03 DIAGNOSIS — L309 Dermatitis, unspecified: Secondary | ICD-10-CM | POA: Diagnosis not present

## 2016-11-03 DIAGNOSIS — J189 Pneumonia, unspecified organism: Secondary | ICD-10-CM | POA: Diagnosis not present

## 2016-11-03 DIAGNOSIS — I509 Heart failure, unspecified: Secondary | ICD-10-CM | POA: Diagnosis not present

## 2016-11-03 DIAGNOSIS — Y95 Nosocomial condition: Secondary | ICD-10-CM | POA: Diagnosis present

## 2016-11-03 DIAGNOSIS — I10 Essential (primary) hypertension: Secondary | ICD-10-CM | POA: Diagnosis present

## 2016-11-03 DIAGNOSIS — I110 Hypertensive heart disease with heart failure: Secondary | ICD-10-CM | POA: Diagnosis present

## 2016-11-03 DIAGNOSIS — E785 Hyperlipidemia, unspecified: Secondary | ICD-10-CM | POA: Diagnosis not present

## 2016-11-03 DIAGNOSIS — R05 Cough: Secondary | ICD-10-CM | POA: Diagnosis not present

## 2016-11-03 DIAGNOSIS — A419 Sepsis, unspecified organism: Secondary | ICD-10-CM | POA: Diagnosis not present

## 2016-11-03 DIAGNOSIS — H3530 Unspecified macular degeneration: Secondary | ICD-10-CM | POA: Diagnosis not present

## 2016-11-03 DIAGNOSIS — Z87891 Personal history of nicotine dependence: Secondary | ICD-10-CM | POA: Diagnosis not present

## 2016-11-03 DIAGNOSIS — J40 Bronchitis, not specified as acute or chronic: Secondary | ICD-10-CM

## 2016-11-03 DIAGNOSIS — J9811 Atelectasis: Secondary | ICD-10-CM | POA: Diagnosis not present

## 2016-11-03 LAB — COMPREHENSIVE METABOLIC PANEL
ALT: 17 U/L (ref 17–63)
AST: 24 U/L (ref 15–41)
Albumin: 4.3 g/dL (ref 3.5–5.0)
Alkaline Phosphatase: 69 U/L (ref 38–126)
Anion gap: 9 (ref 5–15)
BUN: 19 mg/dL (ref 6–20)
CHLORIDE: 103 mmol/L (ref 101–111)
CO2: 26 mmol/L (ref 22–32)
CREATININE: 1.03 mg/dL (ref 0.61–1.24)
Calcium: 9.1 mg/dL (ref 8.9–10.3)
Glucose, Bld: 130 mg/dL — ABNORMAL HIGH (ref 65–99)
Potassium: 3.7 mmol/L (ref 3.5–5.1)
Sodium: 138 mmol/L (ref 135–145)
Total Bilirubin: 1.8 mg/dL — ABNORMAL HIGH (ref 0.3–1.2)
Total Protein: 7.3 g/dL (ref 6.5–8.1)

## 2016-11-03 LAB — CBC WITH DIFFERENTIAL/PLATELET
Basophils Absolute: 0 10*3/uL (ref 0–0.1)
Basophils Relative: 0 %
EOS ABS: 0.1 10*3/uL (ref 0–0.7)
EOS PCT: 1 %
HCT: 46 % (ref 40.0–52.0)
Hemoglobin: 15.9 g/dL (ref 13.0–18.0)
LYMPHS ABS: 0.7 10*3/uL — AB (ref 1.0–3.6)
Lymphocytes Relative: 9 %
MCH: 30.9 pg (ref 26.0–34.0)
MCHC: 34.6 g/dL (ref 32.0–36.0)
MCV: 89.1 fL (ref 80.0–100.0)
MONOS PCT: 8 %
Monocytes Absolute: 0.5 10*3/uL (ref 0.2–1.0)
NEUTROS PCT: 82 %
Neutro Abs: 5.9 10*3/uL (ref 1.4–6.5)
PLATELETS: 120 10*3/uL — AB (ref 150–440)
RBC: 5.16 MIL/uL (ref 4.40–5.90)
RDW: 14.1 % (ref 11.5–14.5)
WBC: 7.3 10*3/uL (ref 3.8–10.6)

## 2016-11-03 LAB — LACTIC ACID, PLASMA: LACTIC ACID, VENOUS: 0.9 mmol/L (ref 0.5–1.9)

## 2016-11-03 MED ORDER — DEXTROSE 5 % IV SOLN
2.0000 g | Freq: Once | INTRAVENOUS | Status: DC
  Filled 2016-11-03: qty 2

## 2016-11-03 MED ORDER — ACETAMINOPHEN 500 MG PO TABS
1000.0000 mg | ORAL_TABLET | Freq: Once | ORAL | Status: AC
  Administered 2016-11-03: 1000 mg via ORAL

## 2016-11-03 MED ORDER — VANCOMYCIN HCL IN DEXTROSE 1-5 GM/200ML-% IV SOLN
1000.0000 mg | Freq: Once | INTRAVENOUS | Status: AC
  Administered 2016-11-03: 1000 mg via INTRAVENOUS
  Filled 2016-11-03: qty 200

## 2016-11-03 MED ORDER — IPRATROPIUM-ALBUTEROL 0.5-2.5 (3) MG/3ML IN SOLN
9.0000 mL | Freq: Once | RESPIRATORY_TRACT | Status: AC
  Administered 2016-11-03: 9 mL via RESPIRATORY_TRACT
  Filled 2016-11-03: qty 9

## 2016-11-03 MED ORDER — DEXTROSE 5 % IV SOLN
2.0000 g | Freq: Two times a day (BID) | INTRAVENOUS | Status: DC
  Administered 2016-11-03 – 2016-11-05 (×5): 2 g via INTRAVENOUS
  Filled 2016-11-03 (×7): qty 2

## 2016-11-03 MED ORDER — METHYLPREDNISOLONE SODIUM SUCC 125 MG IJ SOLR
125.0000 mg | Freq: Once | INTRAMUSCULAR | Status: AC
  Administered 2016-11-03: 125 mg via INTRAVENOUS
  Filled 2016-11-03: qty 2

## 2016-11-03 MED ORDER — ACETAMINOPHEN 500 MG PO TABS
ORAL_TABLET | ORAL | Status: AC
  Administered 2016-11-03: 1000 mg via ORAL
  Filled 2016-11-03: qty 2

## 2016-11-03 MED ORDER — VANCOMYCIN HCL 10 G IV SOLR
1250.0000 mg | Freq: Two times a day (BID) | INTRAVENOUS | Status: DC
  Administered 2016-11-04: 1250 mg via INTRAVENOUS
  Filled 2016-11-03 (×3): qty 1250

## 2016-11-03 NOTE — ED Notes (Signed)
Pt given crackers, peanut butter and a sprite. Family at bedside.

## 2016-11-03 NOTE — ED Notes (Signed)
Pt oxygen dropped to mid 80's. Placed on 2 L nasal cannula. Will continue to monitor.   States fever x few days and has had 6 ticks on him over the past few weeks. Grandson stated pt was confused earlier today. States throat pain. Answering questions appropriately. Was 2 person assist from wheelchair to stretcher. States lower leg swelling x 2 months. Pt has cough, denies it being productive.

## 2016-11-03 NOTE — H&P (Signed)
[facility] [facility] [place] NAME: Jesse Meadows    MR#:  [id number]  DATE OF BIRTH:  11-Oct-1926  DATE OF ADMISSION:  11/03/2016  PRIMARY CARE PHYSICIAN: Tracie Harrier, MD   REQUESTING/REFERRING PHYSICIAN: Clearnce Hasten, MD  CHIEF COMPLAINT:   Chief Complaint  Patient presents with  . Fever    HISTORY OF PRESENT ILLNESS:  Jesse Meadows  is a 83 y.o. male who presents with fever, cough, hypoxic episode.  Patient has been having intermittent fevers for past 2-3 days per his report.  Today he had an episode of confusion and hypoxia, corrected with supplemental oxygen.  Imaging here suggests pneumonia, and he meets sepsis criteria.  Hospitalists were called for admission  PAST MEDICAL HISTORY:   Past Medical History:  Diagnosis Date  . Cancer ([place])    skin cancer on the nose  . Complication of anesthesia    could not get bowels to move  . GERD (gastroesophageal reflux disease)   . HLD (hyperlipidemia)   . Hypertension   . Macular degeneration   . Sleep apnea     PAST SURGICAL HISTORY:   Past Surgical History:  Procedure Laterality Date  . BACK SURGERY    . CATARACT EXTRACTION, BILATERAL    . INGUINAL HERNIA REPAIR Left 01/24/2016   Procedure: HERNIA REPAIR INGUINAL ADULT;  Surgeon: Leonie Green, MD;  Location: [facility] ORS;  Service: General;  Laterality: Left;  . UVULOPALATOPHARYNGOPLASTY      SOCIAL HISTORY:   Social History  Substance Use Topics  . Smoking status: Former Research scientist (life sciences)  . Smokeless tobacco: Former Systems developer  . Alcohol use No    FAMILY HISTORY:   Family History  Problem Relation Age of Onset  . Cancer Mother   . CAD Mother   . ALS Father     DRUG ALLERGIES:   Allergies  Allergen Reactions  . Penicillins Rash    MEDICATIONS AT HOME:   Prior to Admission medications   Medication Sig Start Date End Date Taking? Authorizing Provider  cyanocobalamin (,VITAMIN B-12,) 1000 MCG/ML injection Inject 200  mcg into the muscle once a week.    [provider]  HYDROcodone-acetaminophen (NORCO) 5-325 MG tablet Take 1-2 tablets by mouth every 4 (four) hours as needed for moderate pain. 01/24/16   Leonie Green, MD  latanoprost (XALATAN) 0.005 % ophthalmic solution Place 1 drop into both eyes at bedtime.    [provider]  lisinopril (PRINIVIL,ZESTRIL) 40 MG tablet Take 40 mg by mouth daily.     [provider]  meclizine (ANTIVERT) 25 MG tablet Take 1 tablet (25 mg total) by mouth 3 (three) times daily as needed for dizziness or nausea. Patient not taking: Reported on 10/22/2016 08/23/16   Lisa Roca, MD  Multiple Vitamins-Minerals (PRESERVISION AREDS PO) Take 1 tablet by mouth daily.    [provider]  nystatin (MYCOSTATIN/NYSTOP) powder Apply topically 3 (three) times daily. 08/23/16   Lisa Roca, MD  ondansetron (ZOFRAN) 4 MG tablet Take 1 tablet (4 mg total) by mouth every 8 (eight) hours as needed for nausea or vomiting. 08/23/16   Lisa Roca, MD  simvastatin (ZOCOR) 40 MG tablet Take 40 mg by mouth every other day.    [provider]  traZODone (DESYREL) 50 MG tablet Take 50 mg by mouth at bedtime.    [provider]    REVIEW OF SYSTEMS:  Review of Systems  Constitutional: Positive for fever and malaise/fatigue. Negative for  chills and weight loss.  HENT: Negative for ear pain, hearing loss and tinnitus.   Eyes: Negative for blurred vision, double vision, pain and redness.  Respiratory: Positive for cough and shortness of breath. Negative for hemoptysis.   Cardiovascular: Negative for chest pain, palpitations, orthopnea and leg swelling.  Gastrointestinal: Negative for abdominal pain, constipation, diarrhea, nausea and vomiting.  Genitourinary: Negative for dysuria, frequency and hematuria.  Musculoskeletal: Negative for back pain, joint pain and neck pain.  Skin:       No acne, rash, or lesions  Neurological: Negative for  dizziness, tremors, focal weakness and weakness.  Endo/Heme/Allergies: Negative for polydipsia. Does not bruise/bleed easily.  Psychiatric/Behavioral: Negative for depression. The patient is not nervous/anxious and does not have insomnia.      VITAL SIGNS:   Vitals:   11/03/16 2105 11/03/16 2108 11/03/16 2120 11/03/16 2130  BP:   (!) 175/87   Pulse: 74 71 70   Resp: (!) 27 (!) 23 18   Temp:    98.6 F (37 C)  TempSrc:    Oral  SpO2: (!) 88% 95% 97%   Weight:      Height:       Wt Readings from Last 3 Encounters:  11/03/16 99.8 kg (220 lb)  10/22/16 99.8 kg (220 lb)  01/24/16 107 kg (236 lb)    PHYSICAL EXAMINATION:  Physical Exam  Vitals reviewed. Constitutional: He is oriented to person, place, and time. He appears well-developed and well-nourished. No distress.  HENT:  Head: Normocephalic and atraumatic.  Mouth/Throat: Oropharynx is clear and moist.  Eyes: Conjunctivae and EOM are normal. Pupils are equal, round, and reactive to light. No scleral icterus.  Neck: Normal range of motion. Neck supple. No JVD present. No thyromegaly present.  Cardiovascular: Normal rate, regular rhythm and intact distal pulses.  Exam reveals no gallop and no friction rub.   No murmur heard. Respiratory: No respiratory distress. He has wheezes. He has no rales.  ronchi  GI: Soft. Bowel sounds are normal. He exhibits no distension. There is no tenderness.  Musculoskeletal: Normal range of motion. He exhibits no edema.  No arthritis, no gout  Lymphadenopathy:    He has no cervical adenopathy.  Neurological: He is alert and oriented to person, place, and time. No cranial nerve deficit.  No dysarthria, no aphasia  Skin: Skin is warm and dry. No rash noted. No erythema.  Psychiatric: He has a normal mood and affect. His behavior is normal. Judgment and thought content normal.    LABORATORY PANEL:   CBC  Recent Labs Lab 11/03/16 2038  WBC 7.3  HGB 15.9  HCT 46.0  PLT 120*    ------------------------------------------------------------------------------------------------------------------  Chemistries   Recent Labs Lab 11/03/16 2038  NA 138  K 3.7  CL 103  CO2 26  GLUCOSE 130*  BUN 19  CREATININE 1.03  CALCIUM 9.1  AST 24  ALT 17  ALKPHOS 69  BILITOT 1.8*   ------------------------------------------------------------------------------------------------------------------  Cardiac Enzymes No results for input(s): TROPONINI in the last 168 hours. ------------------------------------------------------------------------------------------------------------------  RADIOLOGY:  Dg Chest 2 View  Result Date: 11/03/2016 CLINICAL DATA:  Weaker than normal with fever EXAM: CHEST  2 VIEW COMPARISON:  12/15/2006, CT chest 04/22/2015 FINDINGS: Linear atelectasis at the left lung base. No focal consolidation or pleural effusion current stable cardiomediastinal silhouette. Mild atherosclerosis. No pneumothorax. IMPRESSION: Linear atelectasis at the left base.  No focal pulmonary infiltrate. Electronically Signed   By: Donavan Foil M.D.   On: 11/03/2016 21:03  EKG:   Orders placed or performed during the hospital encounter of 08/23/16  . EKG 12-Lead  . EKG 12-Lead  . ED EKG  . ED EKG  . EKG    IMPRESSION AND PLAN:  Principal Problem:   Sepsis ([place]) - IV antibiotics, lactate WNL, BP stable, cultures sent Active Problems:   HCAP (healthcare-associated pneumonia) - antibiotics and cultures as above   Essential hypertension - stable, continue home meds   HLD (hyperlipidemia) - home meds  All the records are reviewed and case discussed with ED provider. Management plans discussed with the patient and/or family.  DVT PROPHYLAXIS: SubQ lovenox  GI PROPHYLAXIS: None  ADMISSION STATUS: Inpatient  CODE STATUS: Full Code Status History    This patient does not have a recorded code status. Please follow your organizational policy for patients in this  situation.      TOTAL TIME TAKING CARE OF THIS PATIENT: 45 minutes.   ,  [place] 11/03/2016, 10:33 PM  [facility]  Office  [phone]  CC: Primary care physician; Tracie Harrier, MD  Note:  This document was prepared using Dragon voice recognition software and may include unintentional dictation errors.

## 2016-11-03 NOTE — ED Provider Notes (Signed)
[facility] Emergency Department Provider Note  ____________________________________________   First MD Initiated Contact with Patient 11/03/16 2056     (approximate)  I have reviewed the triage vital signs and the nursing notes.   HISTORY  Chief Complaint Fever   HPI Jesse Meadows is a 83 y.o. male who is presenting to the emergency department with 3 days of cough, feverand now with confusion. He was seen by his primary care doctor one day ago who prescribed him doxycycline for possible Lyme disease as well as upper respiratory infection. The patient is also had 6 tick bites over the past several weeks. Does not report any rash. Says that he has had difficulty breathing with a cough productive of a small amount of sputum. Patient denies smoking. His grandson became concerned today when the patient became confused. The patient also admits to slight confusion.   Past Medical History:  Diagnosis Date  . Cancer ([place])    skin cancer on the nose  . Complication of anesthesia    could not get bowels to move  . GERD (gastroesophageal reflux disease)   . HLD (hyperlipidemia)   . Hypertension   . Macular degeneration   . Sleep apnea     Patient Active Problem List   Diagnosis Date Noted  . Sepsis ([place]) 11/03/2016  . HCAP (healthcare-associated pneumonia) 11/03/2016  . HLD (hyperlipidemia) 11/03/2016  . DEHYDRATION 08/27/2010  . LABRYNTHITIS 08/27/2010  . INSOMNIA UNSPECIFIED 08/27/2010  . Essential hypertension 08/14/2010  . ALLERGIC RHINITIS CAUSE UNSPECIFIED 08/14/2010  . SCIATICA, RIGHT 08/14/2010    Past Surgical History:  Procedure Laterality Date  . BACK SURGERY    . CATARACT EXTRACTION, BILATERAL    . INGUINAL HERNIA REPAIR Left 01/24/2016   Procedure: HERNIA REPAIR INGUINAL ADULT;  Surgeon: Leonie Green, MD;  Location: [facility] ORS;  Service: General;  Laterality: Left;  . UVULOPALATOPHARYNGOPLASTY      Prior to Admission  medications   Medication Sig Start Date End Date Taking? Authorizing Provider  cyanocobalamin (,VITAMIN B-12,) 1000 MCG/ML injection Inject 200 mcg into the muscle once a week.    [provider]  HYDROcodone-acetaminophen (NORCO) 5-325 MG tablet Take 1-2 tablets by mouth every 4 (four) hours as needed for moderate pain. 01/24/16   Leonie Green, MD  latanoprost (XALATAN) 0.005 % ophthalmic solution Place 1 drop into both eyes at bedtime.    [provider]  lisinopril (PRINIVIL,ZESTRIL) 40 MG tablet Take 40 mg by mouth daily.     [provider]  meclizine (ANTIVERT) 25 MG tablet Take 1 tablet (25 mg total) by mouth 3 (three) times daily as needed for dizziness or nausea. Patient not taking: Reported on 10/22/2016 08/23/16   Lisa Roca, MD  Multiple Vitamins-Minerals (PRESERVISION AREDS PO) Take 1 tablet by mouth daily.    [provider]  nystatin (MYCOSTATIN/NYSTOP) powder Apply topically 3 (three) times daily. 08/23/16   Lisa Roca, MD  ondansetron (ZOFRAN) 4 MG tablet Take 1 tablet (4 mg total) by mouth every 8 (eight) hours as needed for nausea or vomiting. 08/23/16   Lisa Roca, MD  simvastatin (ZOCOR) 40 MG tablet Take 40 mg by mouth every other day.    [provider]  traZODone (DESYREL) 50 MG tablet Take 50 mg by mouth at bedtime.    [provider]    Allergies Penicillins  Family History  Problem Relation Age of Onset  . Cancer Mother   . CAD Mother   .  ALS Father     Social History Social History  Substance Use Topics  . Smoking status: Former Research scientist (life sciences)  . Smokeless tobacco: Former Systems developer  . Alcohol use No    Review of Systems  Constitutional: fever  Eyes: No visual changes. ENT: No sore throat. Cardiovascular: Denies chest pain. Respiratory: as above Gastrointestinal: No abdominal pain.  No nausea, no vomiting.  No diarrhea.  No constipation. Genitourinary: Negative for dysuria. Musculoskeletal:  Negative for back pain. Skin: Negative for rash. Neurological: Negative for headaches, focal weakness or numbness.   ____________________________________________   PHYSICAL EXAM:  VITAL SIGNS: ED Triage Vitals  Enc Vitals Group     BP 11/03/16 2009 (!) 169/81     Pulse Rate 11/03/16 2006 80     Resp 11/03/16 2006 (!) 22     Temp 11/03/16 2006 (!) 103 F (39.4 C)     Temp Source 11/03/16 2006 Oral     SpO2 11/03/16 2006 91 %     Weight 11/03/16 2013 220 lb (99.8 kg)     Height 11/03/16 2013 5\' 10"  (1.778 m)     Head Circumference --      Peak Flow --      Pain Score 11/03/16 2010 7     Pain Loc --      Pain Edu? --      Excl. in [place]? --     Constitutional: Alert and oriented. Well appearing and in no acute distress. Eyes: Conjunctivae are normal.  Head: Atraumatic. Nose: No congestion/rhinnorhea. Mouth/Throat: Mucous membranes are moist.  Neck: No stridor.   Cardiovascular: Normal rate, regular rhythm. Grossly normal heart sounds.  Good peripheral circulation. Respiratory: Normal respiratory effort.  No retractions. Wheezing throughout with decreased air movement and increased/prolonged expiratory phase. Gastrointestinal: Soft and nontender. No distention. No CVA tenderness. Musculoskeletal: No lower extremity tenderness nor edema.  No joint effusions. Neurologic:  Normal speech and language. No gross focal neurologic deficits are appreciated. Skin:  Skin is warm, dry and intact. No rash noted. Psychiatric: Mood and affect are normal. Speech and behavior are normal.  ____________________________________________   LABS (all labs ordered are listed, but only abnormal results are displayed)  Labs Reviewed  COMPREHENSIVE METABOLIC PANEL - Abnormal; Notable for the following:       Result Value   Glucose, Bld 130 (*)    Total Bilirubin 1.8 (*)    All other components within normal limits  CBC WITH DIFFERENTIAL/PLATELET - Abnormal; Notable for the following:     Platelets 120 (*)    Lymphs Abs 0.7 (*)    All other components within normal limits  CULTURE, BLOOD (ROUTINE X 2)  CULTURE, BLOOD (ROUTINE X 2)  LACTIC ACID, PLASMA  LACTIC ACID, PLASMA  URINALYSIS, COMPLETE (UACMP) WITH MICROSCOPIC   ____________________________________________  EKG  ED ECG REPORT I, Doran Stabler, the attending physician, personally viewed and interpreted this ECG.   Date: 11/03/2016  EKG Time: 2107  Rate: 71  Rhythm: normal sinus rhythm  Axis: normal  Intervals:right bundle branch block  ST&T Change: No ST segment elevation or depression. No abnormal T-wave inversion. Right bundle branch block seen on previous EKG from 08/23/2016. ____________________________________________  RADIOLOGY  Linear atelectasis at the left lung base. No focal pulmonary infiltrate. ____________________________________________   PROCEDURES  Procedure(s) performed:   Procedures  Critical Care performed:   ____________________________________________   INITIAL IMPRESSION / ASSESSMENT AND PLAN / ED COURSE  Pertinent labs & imaging results that were available during  my care of the patient were reviewed by me and considered in my medical decision making (see chart for details).   ----------------------------------------- 10:27 PM on 11/03/2016 -----------------------------------------  Patient to be admitted to the hospital.  Patient antibiotics. Hypoxic to 88% on room air with wheezing throughout. Also persistently febrile. No rash. Infection seems to be respiratory. We will send Lyme titers at this time. Patient will be admitted to the hospital. Patient understands the plan as well as family and are willing to comply.      ____________________________________________   FINAL CLINICAL IMPRESSION(S) / ED DIAGNOSES  Bronchitis. Sepsis. Hypoxia.    NEW MEDICATIONS STARTED DURING THIS VISIT:  New Prescriptions   No medications on file     Note:   This document was prepared using Dragon voice recognition software and may include unintentional dictation errors.     Orbie Pyo, MD 11/03/16 2228

## 2016-11-03 NOTE — Progress Notes (Signed)
Pharmacy Antibiotic Note  Jesse Meadows is a 83 y.o. male admitted on 11/03/2016 with pneumonia.  Pharmacy has been consulted for vanc/cefepime dosing.  Plan: Patient received vanc 1g IV x 1 in ED  Will follow up w/ vanc 1.25g IV q12h w/ 6 hour stack dose Will draw vanc trough 5/24 @ 1500 prior to 4th dose. Ke 0.0531 T1/2 12 hours  Will start cefepime 2g IV q12h  Height: 5\' 10"  (177.8 cm) Weight: 220 lb (99.8 kg) IBW/kg (Calculated) : 73  Temp (24hrs), Avg:100.8 F (38.2 C), Min:98.6 F (37 C), Max:103 F (39.4 C)   Recent Labs Lab 11/03/16 2038  WBC 7.3  CREATININE 1.03  LATICACIDVEN 0.9    Estimated Creatinine Clearance: 58.7 mL/min (by C-G formula based on SCr of 1.03 mg/dL).    Allergies  Allergen Reactions  . Penicillins Rash     Thank you for allowing pharmacy to be a part of this patient's care.  Tobie Lords, PharmD, BCPS Clinical Pharmacist 11/03/2016

## 2016-11-03 NOTE — ED Triage Notes (Signed)
Pt came in with family, pt reports being weaker than normal.  Pt had fever of 102 at home.  Pt's family states he was seen yesterday and given antibiotics and given cough medication.  Pt denies taking OTC medication for fever PTA.  Pt in NAD in triage.  Pt states that he has had a low grade fever since Friday.  Pt is A&Ox4 in triage.

## 2016-11-03 NOTE — ED Notes (Signed)
Pt currently in xray

## 2016-11-04 DIAGNOSIS — L8990 Pressure ulcer of unspecified site, unspecified stage: Secondary | ICD-10-CM | POA: Insufficient documentation

## 2016-11-04 LAB — CBC
HCT: 43 % (ref 40.0–52.0)
Hemoglobin: 15.3 g/dL (ref 13.0–18.0)
MCH: 32.1 pg (ref 26.0–34.0)
MCHC: 35.5 g/dL (ref 32.0–36.0)
MCV: 90.4 fL (ref 80.0–100.0)
PLATELETS: 104 10*3/uL — AB (ref 150–440)
RBC: 4.75 MIL/uL (ref 4.40–5.90)
RDW: 13.8 % (ref 11.5–14.5)
WBC: 5.1 10*3/uL (ref 3.8–10.6)

## 2016-11-04 LAB — URINALYSIS, COMPLETE (UACMP) WITH MICROSCOPIC
Bacteria, UA: NONE SEEN
Bilirubin Urine: NEGATIVE
Glucose, UA: 150 mg/dL — AB
Hgb urine dipstick: NEGATIVE
KETONES UR: 5 mg/dL — AB
Leukocytes, UA: NEGATIVE
Nitrite: NEGATIVE
PH: 5 (ref 5.0–8.0)
Protein, ur: 100 mg/dL — AB
Specific Gravity, Urine: 1.028 (ref 1.005–1.030)
Squamous Epithelial / LPF: NONE SEEN

## 2016-11-04 LAB — BASIC METABOLIC PANEL
Anion gap: 9 (ref 5–15)
BUN: 18 mg/dL (ref 6–20)
CALCIUM: 8.7 mg/dL — AB (ref 8.9–10.3)
CO2: 28 mmol/L (ref 22–32)
Chloride: 101 mmol/L (ref 101–111)
Creatinine, Ser: 0.96 mg/dL (ref 0.61–1.24)
GFR calc non Af Amer: >60 mL/min (ref >60)
Glucose, Bld: 188 mg/dL — ABNORMAL HIGH (ref 65–99)
Potassium: 3.5 mmol/L (ref 3.5–5.1)
Sodium: 138 mmol/L (ref 135–145)

## 2016-11-04 MED ORDER — LATANOPROST 0.005 % OP SOLN
1.0000 [drp] | Freq: Every day | OPHTHALMIC | Status: DC
  Administered 2016-11-04 – 2016-11-05 (×3): 1 [drp] via OPHTHALMIC
  Filled 2016-11-04: qty 2.5

## 2016-11-04 MED ORDER — DOXYCYCLINE HYCLATE 100 MG PO TABS
100.0000 mg | ORAL_TABLET | Freq: Two times a day (BID) | ORAL | Status: DC
  Administered 2016-11-04 – 2016-11-06 (×5): 100 mg via ORAL
  Filled 2016-11-04 (×5): qty 1

## 2016-11-04 MED ORDER — HYDROCORTISONE 1 % EX CREA
TOPICAL_CREAM | Freq: Three times a day (TID) | CUTANEOUS | Status: DC
  Administered 2016-11-04: 15:00:00 via TOPICAL
  Administered 2016-11-04: 1 via TOPICAL
  Administered 2016-11-04 – 2016-11-05 (×3): via TOPICAL
  Filled 2016-11-04: qty 28

## 2016-11-04 MED ORDER — HYDRALAZINE HCL 20 MG/ML IJ SOLN: 10.0000 mg | Freq: Four times a day (QID) | INTRAMUSCULAR | Status: DC | PRN

## 2016-11-04 MED ORDER — ACETAMINOPHEN 650 MG RE SUPP
650.0000 mg | Freq: Four times a day (QID) | RECTAL | Status: DC | PRN
Start: 2016-11-04 — End: 2016-11-06

## 2016-11-04 MED ORDER — ONDANSETRON HCL 4 MG PO TABS: 4.0000 mg | ORAL_TABLET | Freq: Four times a day (QID) | ORAL | Status: DC | PRN

## 2016-11-04 MED ORDER — LISINOPRIL 10 MG PO TABS
40.0000 mg | ORAL_TABLET | Freq: Every day | ORAL | Status: DC
  Administered 2016-11-04 – 2016-11-06 (×3): 40 mg via ORAL
  Filled 2016-11-04 (×3): qty 4

## 2016-11-04 MED ORDER — METHYLPREDNISOLONE SODIUM SUCC 125 MG IJ SOLR
60.0000 mg | Freq: Four times a day (QID) | INTRAMUSCULAR | Status: DC
  Administered 2016-11-04: 60 mg via INTRAVENOUS
  Filled 2016-11-04: qty 2

## 2016-11-04 MED ORDER — ENOXAPARIN SODIUM 40 MG/0.4ML ~~LOC~~ SOLN
40.0000 mg | SUBCUTANEOUS | Status: DC
  Administered 2016-11-04: 40 mg via SUBCUTANEOUS
  Filled 2016-11-04: qty 0.4

## 2016-11-04 MED ORDER — IPRATROPIUM-ALBUTEROL 0.5-2.5 (3) MG/3ML IN SOLN: 9.0000 mL | RESPIRATORY_TRACT | Status: DC | PRN

## 2016-11-04 MED ORDER — ENOXAPARIN SODIUM 40 MG/0.4ML ~~LOC~~ SOLN
40.0000 mg | SUBCUTANEOUS | Status: DC
  Administered 2016-11-04 – 2016-11-05 (×2): 40 mg via SUBCUTANEOUS
  Filled 2016-11-04 (×2): qty 0.4

## 2016-11-04 MED ORDER — AMLODIPINE BESYLATE 5 MG PO TABS
5.0000 mg | ORAL_TABLET | Freq: Every day | ORAL | Status: DC
  Administered 2016-11-04 – 2016-11-06 (×3): 5 mg via ORAL
  Filled 2016-11-04 (×3): qty 1

## 2016-11-04 MED ORDER — SIMVASTATIN 10 MG PO TABS
40.0000 mg | ORAL_TABLET | ORAL | Status: DC
  Administered 2016-11-04: 40 mg via ORAL
  Filled 2016-11-04: qty 4

## 2016-11-04 MED ORDER — ONDANSETRON HCL 4 MG/2ML IJ SOLN: 4.0000 mg | Freq: Four times a day (QID) | INTRAMUSCULAR | Status: DC | PRN

## 2016-11-04 MED ORDER — ACETAMINOPHEN 325 MG PO TABS: 650.0000 mg | ORAL_TABLET | Freq: Four times a day (QID) | ORAL | Status: DC | PRN

## 2016-11-04 MED ORDER — HYDROCODONE-ACETAMINOPHEN 5-325 MG PO TABS: 1.0000 | ORAL_TABLET | ORAL | Status: DC | PRN

## 2016-11-04 MED ORDER — TRAZODONE HCL 50 MG PO TABS
50.0000 mg | ORAL_TABLET | Freq: Every day | ORAL | Status: DC
  Administered 2016-11-04 – 2016-11-05 (×3): 50 mg via ORAL
  Filled 2016-11-04 (×3): qty 1

## 2016-11-04 NOTE — Progress Notes (Signed)
Patient is A&Ox4 with intermittent confusion/forgetfullness. Urinary frequency noted this shift with occassional bladder leakage  . PT/eval & treat, ambulated once around nursing station. Continues to desat to 80's while ambulation, continues with 2L-O2. Incentive in place achieving 1000. Complaints of itching to back, applied scheduled cream per order. Abdomen distended and taut.

## 2016-11-04 NOTE — Evaluation (Signed)
Physical Therapy Evaluation Patient Details Name: Jesse Meadows MRN: [id number] DOB: 1927/05/28 Today's Date: 11/04/2016   History of Present Illness  Patient is a pleasant 83 y/o male that presents with confusion/hypoxia, which appears to have been well managed via supplemental O2. Believed to have sepsis secondary to community acquired pneumonia.   Clinical Impression  Patient is an 83 y/o male that lives independently at home with a daughter nearby. He reports his home is level, and that he sleeps in a lift chair to assist with sit to stand transfers. He had been scheduled for a hip surgery prior to this admission. He requires assistance this date to perform sit to stand transfer, though he reports this is his baseline. He sleeps in a lift chair to assist with sit to stand transfer. Once he is upright, he is able to ambulate household distances with no loss of balance with a SPC. He did not have any O2 desaturation on 2L (97% at rest to 94% after ambulation). Patient would benefit from HHPT to address strength and mobility deficits for safe return home.     Follow Up Recommendations Home health PT    Equipment Recommendations       Recommendations for Other Services       Precautions / Restrictions Precautions Precautions: Fall Restrictions Weight Bearing Restrictions: No      Mobility  Bed Mobility Overal bed mobility: Needs Assistance Bed Mobility: Supine to Sit     Supine to sit: Min assist;Mod assist     General bed mobility comments: Patient requires min-mod A to manage his torso secondary to trunkal weakness.   Transfers Overall transfer level: Needs assistance Equipment used: Straight cane Transfers: Sit to/from Stand Sit to Stand: Mod assist         General transfer comment: Patient reports he sleeps in a lift chair to help him with standing. In this session he requires physical assistance both for transfer as well as balance throughout transfer.    Ambulation/Gait Ambulation/Gait assistance: Min guard Ambulation Distance (Feet): 200 Feet Assistive device: Straight cane Gait Pattern/deviations: Wide base of support   Gait velocity interpretation: at or above normal speed for age/gender General Gait Details: Patient demonstrates largely normal gait pattern, ER at his hips otherwise WNL. No loss of balance and appropriate gait speed noted.   Stairs            Wheelchair Mobility    Modified Rankin (Stroke Patients Only)       Balance Overall balance assessment: Needs assistance Sitting-balance support: No upper extremity supported Sitting balance-Leahy Scale: Good     Standing balance support: Single extremity supported Standing balance-Leahy Scale: Good                               Pertinent Vitals/Pain Pain Assessment: Faces Faces Pain Scale: Hurts little more Pain Location: R hip  Pain Descriptors / Indicators: Aching Pain Intervention(s): Limited activity within patient's tolerance;Monitored during session;Repositioned    Home Living Family/patient expects to be discharged to:: Private residence Living Arrangements: Children Available Help at Discharge: Available PRN/intermittently Type of Home: House Home Access: Level entry     Home Layout: One level Home Equipment: Environmental consultant - 2 wheels;Cane - single point Additional Comments: Patient sleeps in a lift chair.     Prior Function Level of Independence: Independent with assistive device(s)         Comments: Patient denies any recent falls  while ambulating with a SPC.      Hand Dominance        Extremity/Trunk Assessment   Upper Extremity Assessment Upper Extremity Assessment: Overall WFL for tasks assessed    Lower Extremity Assessment Lower Extremity Assessment: Overall WFL for tasks assessed       Communication   Communication: No difficulties  Cognition Arousal/Alertness: Awake/alert Behavior During Therapy: WFL for  tasks assessed/performed Overall Cognitive Status: Within Functional Limits for tasks assessed                                        General Comments      Exercises     Assessment/Plan    PT Assessment Patient needs continued PT services  PT Problem List Decreased strength;Decreased mobility;Pain;Decreased balance;Decreased activity tolerance       PT Treatment Interventions DME instruction;Therapeutic activities;Therapeutic exercise;Gait training;Stair training;Balance training;Neuromuscular re-education    PT Goals (Current goals can be found in the Care Plan section)  Acute Rehab PT Goals Patient Stated Goal: To return home  PT Goal Formulation: With patient Time For Goal Achievement: 11/18/16 Potential to Achieve Goals: Good    Frequency Min 2X/week   Barriers to discharge        Co-evaluation               AM-PAC PT "6 Clicks" Daily Activity  Outcome Measure Difficulty turning over in bed (including adjusting bedclothes, sheets and blankets)?: A Little Difficulty moving from lying on back to sitting on the side of the bed? : A Little Difficulty sitting down on and standing up from a chair with arms (e.g., wheelchair, bedside commode, etc,.)?: A Lot Help needed moving to and from a bed to chair (including a wheelchair)?: A Little Help needed walking in hospital room?: None Help needed climbing 3-5 steps with a railing? : None 6 Click Score: 19    End of Session Equipment Utilized During Treatment: Gait belt;Oxygen Activity Tolerance: Patient tolerated treatment well Patient left: in chair;with chair alarm set;with call bell/phone within reach Nurse Communication: Mobility status PT Visit Diagnosis: Muscle weakness (generalized) (M62.81)    Time: 1410-1430 PT Time Calculation (min) (ACUTE ONLY): 20 min   Charges:   PT Evaluation $PT Eval Moderate Complexity: 1 Procedure     PT G Codes:   PT G-Codes **NOT FOR INPATIENT  CLASS** Functional Assessment Tool Used: AM-PAC 6 Clicks Basic Mobility Functional Limitation: Mobility: Walking and moving around Mobility: Walking and Moving Around Current Status (E2336): At least 20 percent but less than 40 percent impaired, limited or restricted Mobility: Walking and Moving Around Goal Status [phone]): At least 1 percent but less than 20 percent impaired, limited or restricted   Royce Macadamia PT, DPT, CSCS     11/04/2016, 3:27 PM

## 2016-11-04 NOTE — Care Management Note (Signed)
Case Management Note  Patient Details  Name: Jesse Meadows MRN: [id number] Date of Birth: 25-Nov-1926  Subjective/Objective:   TC to daughter, Madelaine Bhat with no answer and no VM. No family in room                 Action/Plan:   Expected Discharge Date:                  Expected Discharge Plan:  [place]  In-House Referral:     Discharge planning Services     Post Acute Care Choice:    Choice offered to:     DME Arranged:    DME Agency:     HH Arranged:    [place] Agency:     Status of Service:  In process, will continue to follow  If discussed at Long Length of Stay Meetings, dates discussed:    Additional Comments:  Jolly Mango, RN 11/04/2016, 3:45 PM

## 2016-11-04 NOTE — Progress Notes (Signed)
[facility] [place] NAME: Jesse Meadows    MR#:  [id number]  DATE OF BIRTH:  April 21, 1927  SUBJECTIVE:  CHIEF COMPLAINT:   Chief Complaint  Patient presents with  . Fever   - Admitted with shortness of breath and noted to have pneumonia. -On 2 L oxygen this morning. Not on home oxygen. -Complains of pruritic rash and upper back  REVIEW OF SYSTEMS:  Review of Systems  Constitutional: Negative for chills, fever and malaise/fatigue.  HENT: Negative for ear discharge, hearing loss, nosebleeds and sore throat.   Eyes: Negative for blurred vision and double vision.  Respiratory: Negative for cough, shortness of breath and wheezing.   Cardiovascular: Negative for chest pain and palpitations.  Gastrointestinal: Negative for abdominal pain, constipation, diarrhea, nausea and vomiting.  Genitourinary: Negative for dysuria.  Musculoskeletal: Negative for myalgias.  Skin: Positive for rash.  Neurological: Negative for dizziness, speech change, focal weakness, seizures and headaches.  Psychiatric/Behavioral: Negative for depression.    DRUG ALLERGIES:   Allergies  Allergen Reactions  . Penicillins Rash and Other (See Comments)    Has patient had a PCN reaction causing immediate rash, facial/tongue/throat swelling, SOB or lightheadedness with hypotension: No Has patient had a PCN reaction causing severe rash involving mucus membranes or skin necrosis: No Has patient had a PCN reaction that required hospitalization: No Has patient had a PCN reaction occurring within the last 10 years: No If all of the above answers are "NO", then may proceed with Cephalosporin use.     VITALS:  Blood pressure (!) 205/72, pulse 73, temperature 97.6 F (36.4 C), temperature source Oral, resp. rate 18, height 5\' 10"  (1.778 m), weight 99.8 kg (220 lb), SpO2 93 %.  PHYSICAL EXAMINATION:  Physical Exam  GENERAL:  83 y.o.-year-old patient lying in the bed with  no acute distress.  EYES: Pupils equal, round, reactive to light and accommodation. No scleral icterus. Extraocular muscles intact.  HEENT: Head atraumatic, normocephalic. Oropharynx and nasopharynx clear.  NECK:  Supple, no jugular venous distention. No thyroid enlargement, no tenderness.  LUNGS: Normal breath sounds bilaterally, no wheezing, rales,rhonchi or crepitation. No use of accessory muscles of respiration. Decreased bibasilar breath sounds CARDIOVASCULAR: S1, S2 normal. No  rubs, or gallops. 2/6 systolic murmur is present ABDOMEN: Soft, nontender, nondistended. Bowel sounds present. No organomegaly or mass.  EXTREMITIES: No pedal edema, cyanosis, or clubbing.  NEUROLOGIC: Cranial nerves II through XII are intact. Muscle strength 5/5 in all extremities. Sensation intact. Gait not checked.  PSYCHIATRIC: The patient is alert and oriented x 3.  SKIN: No obvious lesion, or ulcer. Pruritus take macular rash and upper back with scabs from scratching. Looks like eczema   LABORATORY PANEL:   CBC  Recent Labs Lab 11/04/16 0438  WBC 5.1  HGB 15.3  HCT 43.0  PLT 104*   ------------------------------------------------------------------------------------------------------------------  Chemistries   Recent Labs Lab 11/03/16 2038 11/04/16 0438  NA 138 138  K 3.7 3.5  CL 103 101  CO2 26 28  GLUCOSE 130* 188*  BUN 19 18  CREATININE 1.03 0.96  CALCIUM 9.1 8.7*  AST 24  --   ALT 17  --   ALKPHOS 69  --   BILITOT 1.8*  --    ------------------------------------------------------------------------------------------------------------------  Cardiac Enzymes No results for input(s): TROPONINI in the last 168 hours. ------------------------------------------------------------------------------------------------------------------  RADIOLOGY:  Dg Chest 2 View  Result Date: 11/03/2016 CLINICAL DATA:  Weaker than normal with fever EXAM: CHEST  2 VIEW COMPARISON:  12/15/2006, CT  chest 04/22/2015 FINDINGS: Linear atelectasis at the left lung base. No focal consolidation or pleural effusion current stable cardiomediastinal silhouette. Mild atherosclerosis. No pneumothorax. IMPRESSION: Linear atelectasis at the left base.  No focal pulmonary infiltrate. Electronically Signed   By: Donavan Foil M.D.   On: 11/03/2016 21:03    EKG:   Orders placed or performed during the hospital encounter of 08/23/16  . EKG 12-Lead  . EKG 12-Lead  . ED EKG  . ED EKG  . EKG    ASSESSMENT AND PLAN:   83 year old male with past medical history significant for skin cancer, GERD, hypertension, macular degeneration presents to hospital secondary to confusion, hypoxia and noted to have pneumonia.  #1 sepsis-secondary to community-acquired pneumonia. -Blood cultures are pending - MRSA PCR is negative recently. Discontinue vancomycin. -Currently on cefepime. Monitor and narrow antibiotics after 24 hours. -Continue to wean oxygen. Encourage incentive spirometry. -Encourage ambulation -Had reactive operating room wheezing yesterday and was started on IV steroids. Much improved today. Discontinue steroids.  #2 pruritic upper back rash-likely eczema. Try hydrocortisone cream. -History of tick bites, but the rash is not typical of that Monitor, lymes titres sent on admission  #3 hyperlipidemia-continue statin  #4 hypertension-continue lisinopril. When necessary IV hydralazine as needed.  #5 DVT prophylaxis-on Lovenox  Physical therapy consulted   All the records are reviewed and case discussed with Care Management/Social Workerr. Management plans discussed with the patient, family and they are in agreement.  CODE STATUS: Full code  TOTAL TIME TAKING CARE OF THIS PATIENT: 38 minutes.   POSSIBLE D/C tomorrow, DEPENDING ON CLINICAL CONDITION.   Gladstone Lighter M.D on 11/04/2016 at 8:20 AM  Between 7am to 6pm - Pager - [phone]  After 6pm go to [email or web address] - password  [facility]  Office  [phone]  CC: Primary care physician; Tracie Harrier, MD

## 2016-11-04 NOTE — Progress Notes (Signed)
Jesse Meadows 83 y/o male. Patient was transferred from the ER following c/o fever and some confusion. On admssion patient was A&O X4, denied being in any pain . Patient was on acute supplemental oxygen. Patient was oriented to his room and  care plan was reviewed. Patient remained hemodynamically stable and VS WDl for patient. Patient was assisted as needed.

## 2016-11-04 NOTE — Progress Notes (Signed)
SBP elevated, per conversation MD administer meds early, new order also placed for prn for SBP.

## 2016-11-05 ENCOUNTER — Encounter: Admission: RE | Payer: Self-pay | Source: Ambulatory Visit

## 2016-11-05 ENCOUNTER — Inpatient Hospital Stay: Admission: RE | Admit: 2016-11-05 | Payer: PPO | Source: Ambulatory Visit | Admitting: Orthopedic Surgery

## 2016-11-05 ENCOUNTER — Inpatient Hospital Stay: Payer: PPO

## 2016-11-05 LAB — B. BURGDORFI ANTIBODIES: B burgdorferi Ab IgG+IgM: <0.91 {ISR} (ref 0.00–0.90)

## 2016-11-05 LAB — CBC
HEMATOCRIT: 48 % (ref 40.0–52.0)
Hemoglobin: 16.7 g/dL (ref 13.0–18.0)
MCH: 31.2 pg (ref 26.0–34.0)
MCHC: 34.8 g/dL (ref 32.0–36.0)
MCV: 89.8 fL (ref 80.0–100.0)
Platelets: 116 10*3/uL — ABNORMAL LOW (ref 150–440)
RBC: 5.34 MIL/uL (ref 4.40–5.90)
RDW: 14 % (ref 11.5–14.5)
WBC: 8.5 10*3/uL (ref 3.8–10.6)

## 2016-11-05 LAB — BASIC METABOLIC PANEL
Anion gap: 9 (ref 5–15)
BUN: 23 mg/dL — AB (ref 6–20)
CALCIUM: 9.3 mg/dL (ref 8.9–10.3)
CHLORIDE: 95 mmol/L — AB (ref 101–111)
CO2: 32 mmol/L (ref 22–32)
CREATININE: 0.91 mg/dL (ref 0.61–1.24)
GFR calc non Af Amer: >60 mL/min (ref >60)
Glucose, Bld: 128 mg/dL — ABNORMAL HIGH (ref 65–99)
Potassium: 4.2 mmol/L (ref 3.5–5.1)
Sodium: 136 mmol/L (ref 135–145)

## 2016-11-05 SURGERY — ARTHROPLASTY, HIP, TOTAL, ANTERIOR APPROACH
Anesthesia: Choice | Laterality: Right

## 2016-11-05 MED ORDER — IOPAMIDOL (ISOVUE-370) INJECTION 76%
75.0000 mL | Freq: Once | INTRAVENOUS | Status: AC | PRN
  Administered 2016-11-05: 75 mL via INTRAVENOUS

## 2016-11-05 MED ORDER — IPRATROPIUM-ALBUTEROL 0.5-2.5 (3) MG/3ML IN SOLN
3.0000 mL | RESPIRATORY_TRACT | Status: DC
Start: 2016-11-05 — End: 2016-11-06
  Administered 2016-11-05 – 2016-11-06 (×4): 3 mL via RESPIRATORY_TRACT
  Filled 2016-11-05 (×5): qty 3

## 2016-11-05 MED ORDER — FUROSEMIDE 10 MG/ML IJ SOLN
20.0000 mg | Freq: Once | INTRAMUSCULAR | Status: AC
  Administered 2016-11-05: 20 mg via INTRAVENOUS
  Filled 2016-11-05: qty 2

## 2016-11-05 MED ORDER — METHYLPREDNISOLONE SODIUM SUCC 125 MG IJ SOLR
60.0000 mg | INTRAMUSCULAR | Status: DC
Start: 2016-11-05 — End: 2016-11-06
  Administered 2016-11-05 – 2016-11-06 (×2): 60 mg via INTRAVENOUS
  Filled 2016-11-05 (×2): qty 2

## 2016-11-05 MED ORDER — POLYETHYLENE GLYCOL 3350 17 G PO PACK
17.0000 g | PACK | Freq: Every day | ORAL | Status: DC
  Administered 2016-11-05 – 2016-11-06 (×2): 17 g via ORAL
  Filled 2016-11-05 (×2): qty 1

## 2016-11-05 NOTE — Progress Notes (Signed)
[facility] at [place] NAME: Jesse Meadows    MR#:  [id number]  DATE OF BIRTH:  December 06, 1926  SUBJECTIVE:  CHIEF COMPLAINT:   Chief Complaint  Patient presents with  . Fever   - Admitted with shortness of breath and noted to have pneumonia. -On 2 L oxygen this morning. Not on home oxygen. -Complains of pruritic rash and upper back  REVIEW OF SYSTEMS:  Review of Systems  Constitutional: Negative for chills, fever and malaise/fatigue.  HENT: Negative for ear discharge, hearing loss, nosebleeds and sore throat.   Eyes: Negative for blurred vision and double vision.  Respiratory: Negative for cough, shortness of breath and wheezing.   Cardiovascular: Negative for chest pain and palpitations.  Gastrointestinal: Negative for abdominal pain, constipation, diarrhea, nausea and vomiting.  Genitourinary: Negative for dysuria.  Musculoskeletal: Negative for myalgias.  Skin: Positive for rash.  Neurological: Negative for dizziness, speech change, focal weakness, seizures and headaches.  Psychiatric/Behavioral: Negative for depression.    DRUG ALLERGIES:   Allergies  Allergen Reactions  . Penicillins Rash and Other (See Comments)    Has patient had a PCN reaction causing immediate rash, facial/tongue/throat swelling, SOB or lightheadedness with hypotension: No Has patient had a PCN reaction causing severe rash involving mucus membranes or skin necrosis: No Has patient had a PCN reaction that required hospitalization: No Has patient had a PCN reaction occurring within the last 10 years: No If all of the above answers are "NO", then may proceed with Cephalosporin use.     VITALS:  Blood pressure (!) 181/75, pulse 80, temperature 98.4 F (36.9 C), temperature source Oral, resp. rate (!) 26, height 5\' 10"  (1.778 m), weight 99.8 kg (220 lb), SpO2 95 %.  PHYSICAL EXAMINATION:  Physical Exam  GENERAL:  83 y.o.-year-old patient lying in the bed  with no acute distress.  EYES: Pupils equal, round, reactive to light and accommodation. No scleral icterus. Extraocular muscles intact.  HEENT: Head atraumatic, normocephalic. Oropharynx and nasopharynx clear.  NECK:  Supple, no jugular venous distention. No thyroid enlargement, no tenderness.  LUNGS: Normal breath sounds bilaterally, no wheezing, rales,rhonchi or crepitation. No use of accessory muscles of respiration. Decreased bibasilar breath sounds CARDIOVASCULAR: S1, S2 normal. No  rubs, or gallops. 2/6 systolic murmur is present ABDOMEN: Soft, nontender, nondistended. Bowel sounds present. No organomegaly or mass.  EXTREMITIES: No pedal edema, cyanosis, or clubbing.  NEUROLOGIC: Cranial nerves II through XII are intact. Muscle strength 5/5 in all extremities. Sensation intact. Gait not checked.  PSYCHIATRIC: The patient is alert and oriented x 3.  SKIN: No obvious lesion, or ulcer. Pruritus take macular rash and upper back with scabs from scratching. Looks like eczema   LABORATORY PANEL:   CBC  Recent Labs Lab 11/05/16 0514  WBC 8.5  HGB 16.7  HCT 48.0  PLT 116*   ------------------------------------------------------------------------------------------------------------------  Chemistries   Recent Labs Lab 11/03/16 2038  11/05/16 0514  NA 138  < > 136  K 3.7  < > 4.2  CL 103  < > 95*  CO2 26  < > 32  GLUCOSE 130*  < > 128*  BUN 19  < > 23*  CREATININE 1.03  < > 0.91  CALCIUM 9.1  < > 9.3  AST 24  --   --   ALT 17  --   --   ALKPHOS 69  --   --   BILITOT 1.8*  --   --   < > =  values in this interval not displayed. ------------------------------------------------------------------------------------------------------------------  Cardiac Enzymes No results for input(s): TROPONINI in the last 168 hours. ------------------------------------------------------------------------------------------------------------------  RADIOLOGY:  Dg Chest 2 View  Result  Date: 11/05/2016 CLINICAL DATA:  Cough and fever for several days, recent tick bites EXAM: CHEST  2 VIEW COMPARISON:  11/03/2016 FINDINGS: Cardiac shadow is prominent accentuated by the frontal technique. This also accentuates the mediastinum as does patient rotation. Left basilar atelectasis is again seen and stable. Mild prominence of the central pulmonary vasculature is noted. No acute bony abnormality is seen. IMPRESSION: Stable appearance of the chest when compared with the prior exam. Electronically Signed   By: Inez Catalina M.D.   On: 11/05/2016 09:32   Dg Chest 2 View  Result Date: 11/03/2016 CLINICAL DATA:  Weaker than normal with fever EXAM: CHEST  2 VIEW COMPARISON:  12/15/2006, CT chest 04/22/2015 FINDINGS: Linear atelectasis at the left lung base. No focal consolidation or pleural effusion current stable cardiomediastinal silhouette. Mild atherosclerosis. No pneumothorax. IMPRESSION: Linear atelectasis at the left base.  No focal pulmonary infiltrate. Electronically Signed   By: Donavan Foil M.D.   On: 11/03/2016 21:03    EKG:   Orders placed or performed during the hospital encounter of 08/23/16  . EKG 12-Lead  . EKG 12-Lead  . ED EKG  . ED EKG  . EKG    ASSESSMENT AND PLAN:   83 year old male with past medical history significant for skin cancer, GERD, hypertension, macular degeneration presents to hospital secondary to confusion, hypoxia and noted to have pneumonia.  #1 sepsis-secondary to community-acquired pneumonia. -Blood cultures are negative - MRSA PCR is negative recently. Discontinued vancomycin. -Currently on cefepime.  -Continue to wean oxygen. Encourage incentive spirometry. -Wheezing again today, likely reactive airway disease. Added IV steroids, nebulizer treatments and monitor one dose of IV Lasix given. Has history of CHF with low ejection fraction of 35% -still Needing up to 2 L of oxygen. Since hypoxic, we'll get a CT chest to rule out pulmonary  embolism..  #2 pruritic upper back rash-likely eczema. Try hydrocortisone cream. -History of tick bites, but the rash is not typical of that -Was started on doxycycline as outpatient. Continue to finish off the course  #3 hyperlipidemia-continue statin  #4 hypertension-continue lisinopril. When necessary IV hydralazine as needed.  #5 DVT prophylaxis-on Lovenox  Physical therapy consulted-home health at discharge.   All the records are reviewed and case discussed with Care Management/Social Workerr. Management plans discussed with the patient, family and they are in agreement.  CODE STATUS: Full code  TOTAL TIME TAKING CARE OF THIS PATIENT: 38 minutes.   POSSIBLE D/C tomorrow, DEPENDING ON CLINICAL CONDITION.   Gladstone Lighter M.D on 11/05/2016 at 12:28 PM  Between 7am to 6pm - Pager - [phone]  After 6pm go to [email or web address] - password [facility]  Office  [phone]  CC: Primary care physician; Tracie Harrier, MD

## 2016-11-05 NOTE — Care Management Note (Addendum)
Case Management Note  Patient Details  Name: Jesse Meadows MRN: [id number] Date of Birth: 02/14/1927  Subjective/Objective:   RNCM assessment for discharge planning. TC to daughter, Jocelyn Lamer [phone]) . She states patient lives in a room built onto their home. He normally is independent and able to provide his own adls. He uses a cane. Discussed home health services at DC. Daughter is agreeable and aware there may be a copay with services. Offered choice of home health agencies.Referral to [facility].  Patient remains intermittently confused with increased congestion, CXR planned. Currently on 1L. No chronic home O2. Will need to watch for home O2 needs. PCP is Dr. Ginette Pitman, last seen 1 week ago.                 Action/Plan: [facility] for SN and PT.   Expected Discharge Date:                  Expected Discharge Plan:  [place]  In-House Referral:     Discharge planning Services  CM Consult  Post Acute Care Choice:  Home Health Choice offered to:  Adult Children  DME Arranged:    DME Agency:     HH Arranged:  RN, PT [place] Agency:  [facility] [place]  Status of Service:  In process, will continue to follow  If discussed at Long Length of Stay Meetings, dates discussed:    Additional Comments:  Jolly Mango, RN 11/05/2016, 10:39 AM

## 2016-11-05 NOTE — Progress Notes (Signed)
RN removed tick from right flank, small bruising around tick insertion site. MD notified.   Deri Fuelling, RN

## 2016-11-05 NOTE — Progress Notes (Signed)
Shift assessment completed at 0800. Pt awake, initially on 3LNC of O2, this was changed to 2L with resulting sat of 97%, then turned off with sat of 95%. Pt room air sat dropped to 88% after approx 10 minutes. Pt sounds like he is congested, has productive cough, and upper airway wheezing noted. Pt stated he was usually sob with movement most of the time, did not feel this had changed, lungs clear bilat. HR is regular, telebox intact. Abdomen is soft, bs hyperactive. Pt is wearing incontinence brief,ppp, teds on bilat, no pitting edema noted. piv #20 intact to l fa andr hand, both sites are free of redness and swelling. Since assessment, pt has been incontinent of urine several times, has completed chest x ray and worked with physical therapy. Pt has denied pain.

## 2016-11-05 NOTE — Progress Notes (Signed)
Physical Therapy Treatment Patient Details Name: Jesse Meadows MRN: [id number] DOB: Nov 11, 1926 Today's Date: 11/05/2016    History of Present Illness Patient is a pleasant 83 y/o male that presents with confusion/hypoxia, which appears to have been well managed via supplemental O2. Believed to have sepsis secondary to community acquired pneumonia.     PT Comments    Pt agrees to session.  Inc urine upon arrival.  Pt on room air upon arrival and sats noted at 87%.  Discussed with primary nurse.  Pt put back on 2 lpm O2 and sats increased to 93% and remained throughout session in 90's with gait.   Pt required mod assist to get to edge of bed (typicaly sleeps in recliner).  Pt was able stand and ambulate around unit x 1 with SPC and Min guard/min assist x 1 with wheelchair follow for O2 tank.  No LOB's but gait generally labored with forward posture and wide BOS.  Discussed discharge plan with pt.  He reports he lives with daughter but is typically independent and requires little assist.  Pt with increased difficulty this session with gait and continues to require O2.  Pt stated he prefers [facility] but may do better with RW for balance and energy conservation.  Discussed rehab with pt and he stated he was open to considering it but did not feel like he needed it at this time.    Will continue to monitor pt and adjust d/c disposition as needed.    Follow Up Recommendations  Home health PT;Supervision for mobility/OOB     Equipment Recommendations       Recommendations for Other Services       Precautions / Restrictions Precautions Precautions: Fall Restrictions Weight Bearing Restrictions: No    Mobility  Bed Mobility Overal bed mobility: Needs Assistance Bed Mobility: Supine to Sit;Rolling Rolling: Max assist;+2 for physical assistance   Supine to sit: Mod assist     General bed mobility comments: Patient requires min-mod A to manage his torso secondary to trunkal weakness. ,  sleeps in lift chair at home  Transfers Overall transfer level: Needs assistance Equipment used: Straight cane Transfers: Sit to/from Stand Sit to Stand: Mod assist            Ambulation/Gait Ambulation/Gait assistance: Min guard;Min assist Ambulation Distance (Feet): 200 Feet Assistive device: Straight cane Gait Pattern/deviations: Wide base of support;Trunk flexed Gait velocity: generally unsteady and laborous, no outright LOB's Gait velocity interpretation: at or above normal speed for age/gender     Stairs            Wheelchair Mobility    Modified Rankin (Stroke Patients Only)       Balance Overall balance assessment: Needs assistance Sitting-balance support: Feet supported Sitting balance-Leahy Scale: Good     Standing balance support: Single extremity supported Standing balance-Leahy Scale: Fair                              Cognition Arousal/Alertness: Awake/alert Behavior During Therapy: WFL for tasks assessed/performed Overall Cognitive Status: Within Functional Limits for tasks assessed                                        Exercises      General Comments        Pertinent Vitals/Pain Pain Assessment: No/denies pain  Home Living                      Prior Function            PT Goals (current goals can now be found in the care plan section) Progress towards PT goals: Progressing toward goals    Frequency    Min 2X/week      PT Plan Current plan remains appropriate;Other (comment)    Co-evaluation              AM-PAC PT "6 Clicks" Daily Activity  Outcome Measure  Difficulty turning over in bed (including adjusting bedclothes, sheets and blankets)?: A Lot Difficulty moving from lying on back to sitting on the side of the bed? : A Lot Difficulty sitting down on and standing up from a chair with arms (e.g., wheelchair, bedside commode, etc,.)?: Total Help needed moving to and  from a bed to chair (including a wheelchair)?: A Little Help needed walking in hospital room?: A Little Help needed climbing 3-5 steps with a railing? : A Lot 6 Click Score: 13    End of Session Equipment Utilized During Treatment: Gait belt;Oxygen Activity Tolerance: Patient tolerated treatment well;Patient limited by fatigue Patient left: in chair;with chair alarm set;with call bell/phone within reach Nurse Communication: Other (comment)       Time: 3143-8887 PT Time Calculation (min) (ACUTE ONLY): 17 min  Charges:  $Gait Training: 8-22 mins                    G Codes:       Chesley Noon, PTA 11/05/16, 11:58 AM

## 2016-11-06 LAB — BASIC METABOLIC PANEL
ANION GAP: 7 (ref 5–15)
BUN: 31 mg/dL — ABNORMAL HIGH (ref 6–20)
CALCIUM: 8.8 mg/dL — AB (ref 8.9–10.3)
CO2: 32 mmol/L (ref 22–32)
Chloride: 96 mmol/L — ABNORMAL LOW (ref 101–111)
Creatinine, Ser: 1.15 mg/dL (ref 0.61–1.24)
GFR calc Af Amer: >60 mL/min (ref >60)
GFR, EST NON AFRICAN AMERICAN: 55 mL/min — AB (ref >60)
GLUCOSE: 116 mg/dL — AB (ref 65–99)
Potassium: 3.8 mmol/L (ref 3.5–5.1)
Sodium: 135 mmol/L (ref 135–145)

## 2016-11-06 MED ORDER — BISACODYL 5 MG PO TBEC
10.0000 mg | DELAYED_RELEASE_TABLET | Freq: Once | ORAL | Status: DC
  Filled 2016-11-06: qty 2

## 2016-11-06 MED ORDER — CEFDINIR 300 MG PO CAPS: 300.0000 mg | ORAL_CAPSULE | Freq: Two times a day (BID) | ORAL | 0 refills | Status: DC

## 2016-11-06 MED ORDER — IPRATROPIUM-ALBUTEROL 0.5-2.5 (3) MG/3ML IN SOLN: 3.0000 mL | Freq: Four times a day (QID) | RESPIRATORY_TRACT | Status: DC

## 2016-11-06 MED ORDER — DOCUSATE SODIUM 100 MG PO CAPS
100.0000 mg | ORAL_CAPSULE | Freq: Two times a day (BID) | ORAL | Status: DC
  Administered 2016-11-06: 100 mg via ORAL
  Filled 2016-11-06: qty 1

## 2016-11-06 MED ORDER — DOCUSATE SODIUM 100 MG PO CAPS: 100.0000 mg | ORAL_CAPSULE | Freq: Two times a day (BID) | ORAL | 0 refills | Status: AC

## 2016-11-06 MED ORDER — CEFPODOXIME PROXETIL 200 MG PO TABS
200.0000 mg | ORAL_TABLET | Freq: Two times a day (BID) | ORAL | Status: DC
  Administered 2016-11-06: 200 mg via ORAL
  Filled 2016-11-06 (×2): qty 1

## 2016-11-06 MED ORDER — PREDNISONE 10 MG (21) PO TBPK: ORAL_TABLET | ORAL | 0 refills | Status: DC

## 2016-11-06 MED ORDER — BUDESONIDE-FORMOTEROL FUMARATE 80-4.5 MCG/ACT IN AERO: 2.0000 | INHALATION_SPRAY | Freq: Two times a day (BID) | RESPIRATORY_TRACT | 0 refills | Status: AC

## 2016-11-06 MED ORDER — DOXYCYCLINE HYCLATE 100 MG PO TABS: 100.0000 mg | ORAL_TABLET | Freq: Two times a day (BID) | ORAL | 0 refills | Status: DC

## 2016-11-06 MED ORDER — AMLODIPINE BESYLATE 5 MG PO TABS: 5.0000 mg | ORAL_TABLET | Freq: Every day | ORAL | 2 refills | Status: AC

## 2016-11-06 NOTE — Progress Notes (Signed)
Physical Therapy Treatment Patient Details Name: Jesse Meadows MRN: [id number] DOB: 1926-07-09 Today's Date: 11/06/2016    History of Present Illness Patient is a pleasant 83 y/o male that presents with confusion/hypoxia, which appears to have been well managed via supplemental O2. Believed to have sepsis secondary to community acquired pneumonia.     PT Comments    Pt required extra effort supine to/from sit but sleeps in a lift chair at home.  Pt able to stand without loss of balance and ambulate around nursing loop using SPC (no loss of balance noted during session).  Significant improvement in pt's functional mobility noted since yesterday's therapy session.  Nursing present during session taking pt's oxygen vitals.  Pt did require O2 to be placed during ambulation d/t O2 desaturation to 86% on room air and this improved to 90% on 2 L with ambulation (pt's O2 90% at rest on room air beginning of session).  Educated pt on oxygen tubing management with functional mobility (pt reporting poor vision and therapist educated pt to have family assist initially with functional mobility to make sure pt did not trip over oxygen tubing and for safety).   Follow Up Recommendations  Home health PT;Supervision for mobility/OOB     Equipment Recommendations  Kasandra Knudsen (pt has own [facility] at home)    Recommendations for Other Services       Precautions / Restrictions Precautions Precautions: Fall Restrictions Weight Bearing Restrictions: No    Mobility  Bed Mobility Overal bed mobility: Needs Assistance Bed Mobility: Supine to Sit;Sit to Supine     Supine to sit: Supervision;HOB elevated Sit to supine: Supervision;HOB elevated   General bed mobility comments: increased effort to perform but no physical assist required; heavy use of bed rail; pt sleeps in lift chair at home  Transfers Overall transfer level: Needs assistance Equipment used: Straight cane Transfers: Sit to/from Stand;Stand Pivot  Transfers Sit to Stand: Supervision;Min guard Stand pivot transfers: Supervision;Min guard       General transfer comment: no loss of balance noted  Ambulation/Gait Ambulation/Gait assistance: Min guard Ambulation Distance (Feet): 200 Feet Assistive device: Straight cane Gait Pattern/deviations: Wide base of support;Trunk flexed     General Gait Details: B LE's externally rotated; mildly "shaky" but no loss of balance noted requiring any assist to correct; no SOB noted   Stairs            Wheelchair Mobility    Modified Rankin (Stroke Patients Only)       Balance Overall balance assessment: Needs assistance Sitting-balance support: No upper extremity supported;Feet supported Sitting balance-Leahy Scale: Good Sitting balance - Comments: sitting reaching within BOS   Standing balance support: No upper extremity supported Standing balance-Leahy Scale: Good Standing balance comment: standing reaching within BOS without UE support (no loss of balance noted); pt did not reach outside BOS d/t reporting h/o vision impairments and does not feel comfortable reaching further at baseline (likes to be "careful).                            Cognition Arousal/Alertness: Awake/alert Behavior During Therapy: WFL for tasks assessed/performed Overall Cognitive Status: Within Functional Limits for tasks assessed                                        Exercises      General Comments General  comments (skin integrity, edema, etc.): Nursing present during session taking oxygen vitals.  Pt agreeable to PT session.      Pertinent Vitals/Pain Pain Assessment: No/denies pain  HR 95-101 bpm during session.    Home Living                      Prior Function            PT Goals (current goals can now be found in the care plan section) Acute Rehab PT Goals Patient Stated Goal: To return home  PT Goal Formulation: With patient Time For Goal  Achievement: 11/18/16 Potential to Achieve Goals: Good Progress towards PT goals: Progressing toward goals    Frequency    Min 2X/week      PT Plan Current plan remains appropriate    Co-evaluation              AM-PAC PT "6 Clicks" Daily Activity  Outcome Measure  Difficulty turning over in bed (including adjusting bedclothes, sheets and blankets)?: A Lot Difficulty moving from lying on back to sitting on the side of the bed? : A Lot Difficulty sitting down on and standing up from a chair with arms (e.g., wheelchair, bedside commode, etc,.)?: Total Help needed moving to and from a bed to chair (including a wheelchair)?: A Little Help needed walking in hospital room?: A Little Help needed climbing 3-5 steps with a railing? : A Little 6 Click Score: 14    End of Session Equipment Utilized During Treatment: Gait belt;Oxygen Activity Tolerance: Patient tolerated treatment well Patient left: in bed;with call bell/phone within reach;with bed alarm set Nurse Communication: Mobility status;Precautions PT Visit Diagnosis: Muscle weakness (generalized) (M62.81)     Time: 1505-6979 PT Time Calculation (min) (ACUTE ONLY): 17 min  Charges:  $Therapeutic Activity: 8-22 mins                    G CodesLeitha Bleak, PT 11/06/16, 12:41 PM [phone]

## 2016-11-06 NOTE — Progress Notes (Signed)
Pt dc'd via wc to front entrance of facility at this time. This Probation officer removed pt's piv's with catheters intact, pt tolerated well. O2 tank was delivered to pt's room, and pt's relative had phone number to call for home o2 when they got home. Relative received scripts, and d/c instructions were reviewed, signed. Pt in no distress at discharge.

## 2016-11-06 NOTE — Plan of Care (Signed)
Problem: Bowel/Gastric: Goal: Will not experience complications related to bowel motility Outcome: Completed/Met Date Met: 11/06/16 Pt has met all goals for discharge.

## 2016-11-06 NOTE — Progress Notes (Signed)
Shift assessment completed. Pt is awake, alert and oriented, is on room air, stated he feels better, has less nasal congestion than yesterday, lungs clear bilat. Hr is regular, abdomen is soft, bs heard. Pt stated last bm 3 days ago. Pt is wearing incontinence brief,ppp, no edema noted. piv #20 intact to r hand and l fa, sites are free of redness and swelling. Pt is asking to go home tomorrow.

## 2016-11-06 NOTE — Discharge Summary (Signed)
Jesse Meadows at [place] NAME: Jesse Meadows    MR#:  [id number]  DATE OF BIRTH:  [birth year]  DATE OF ADMISSION:  11/03/2016   ADMITTING PHYSICIAN: Lance Coon, MD  DATE OF DISCHARGE: 11/06/2016  PRIMARY CARE PHYSICIAN: Tracie Harrier, MD   ADMISSION DIAGNOSIS:   Bronchitis [J40] Hypoxia [R09.02] Sepsis, due to unspecified organism ([place]) [A41.9]  DISCHARGE DIAGNOSIS:   Principal Problem:   Sepsis ([place]) Active Problems:   Essential hypertension   HCAP (healthcare-associated pneumonia)   HLD (hyperlipidemia)   Pressure injury of skin   SECONDARY DIAGNOSIS:   Past Medical History:  Diagnosis Date  . Cancer ([place])    skin cancer on the nose  . Complication of anesthesia    could not get bowels to move  . GERD (gastroesophageal reflux disease)   . HLD (hyperlipidemia)   . Hypertension   . Macular degeneration   . Sleep apnea     HOSPITAL COURSE:   83 year old male with past medical history significant for skin cancer, GERD, hypertension, macular degeneration presents to hospital secondary to confusion, hypoxia and noted to have pneumonia.  #1 sepsis-secondary to community-acquired pneumonia. -Blood cultures are negative - MRSA PCR is negative recently. Received cefepime in the hospital. Being discharged on [place] -Very difficult to wean from the oxygen. Likely from his underlying CHF. Has chronic systolic CHF with last known EF of 40% - O2 sats on room air are normal today. Check ambulatory sats and if needed to discharge on home oxygen. --Continue to wean oxygen. Encourage incentive spirometry. - reactive airway disease. Responded to IV steroids, nebulizer treatments - discharge on oral prednisone and symbicort - CT chest showing no pulmonary embolism, has bibasilar atelectasis, no pneumonia. No active CHF noted. Cardiomegaly seen.  #2 pruritic upper back rash-likely eczema. Try hydrocortisone cream. -History of  tick bites, also take was taken off his body yesterday. Continue doxycycline  #3 hyperlipidemia-continue statin  #4 hypertension-continue lisinopril. Norvasc has been added.   Physical therapy consulted-home health at discharge.  DISCHARGE CONDITIONS:   Guarded CONSULTS OBTAINED:    none  DRUG ALLERGIES:   Allergies  Allergen Reactions  . Penicillins Rash and Other (See Comments)    Has patient had a PCN reaction causing immediate rash, facial/tongue/throat swelling, SOB or lightheadedness with hypotension: No Has patient had a PCN reaction causing severe rash involving mucus membranes or skin necrosis: No Has patient had a PCN reaction that required hospitalization: No Has patient had a PCN reaction occurring within the last 10 years: No If all of the above answers are "NO", then may proceed with Cephalosporin use.    DISCHARGE MEDICATIONS:   Allergies as of 11/06/2016      Reactions   Penicillins Rash, Other (See Comments)   Has patient had a PCN reaction causing immediate rash, facial/tongue/throat swelling, SOB or lightheadedness with hypotension: No Has patient had a PCN reaction causing severe rash involving mucus membranes or skin necrosis: No Has patient had a PCN reaction that required hospitalization: No Has patient had a PCN reaction occurring within the last 10 years: No If all of the above answers are "NO", then may proceed with Cephalosporin use.      Medication List    TAKE these medications   amLODipine 5 MG tablet Commonly known as:  NORVASC Take 1 tablet (5 mg total) by mouth daily. Start taking on:  11/07/2016   budesonide-formoterol 80-4.5 MCG/ACT inhaler Commonly known as:  SYMBICORT Inhale  2 puffs into the lungs 2 (two) times daily.   cefdinir 300 MG capsule Commonly known as:  OMNICEF Take 1 capsule (300 mg total) by mouth 2 (two) times daily. X 5 more days   cyanocobalamin 1000 MCG/ML injection Commonly known as:  (VITAMIN  B-12) Inject 200 mcg into the muscle once a week.   docusate sodium 100 MG capsule Commonly known as:  COLACE Take 1 capsule (100 mg total) by mouth 2 (two) times daily.   doxycycline 100 MG tablet Commonly known as:  VIBRA-TABS Take 1 tablet (100 mg total) by mouth 2 (two) times daily. X 8 more days   HYDROcodone-acetaminophen 5-325 MG tablet Commonly known as:  NORCO Take 1-2 tablets by mouth every 4 (four) hours as needed for moderate pain.   latanoprost 0.005 % ophthalmic solution Commonly known as:  XALATAN Place 1 drop into both eyes at bedtime.   lisinopril 40 MG tablet Commonly known as:  PRINIVIL,ZESTRIL Take 40 mg by mouth daily.   nystatin powder Commonly known as:  MYCOSTATIN/NYSTOP Apply topically 3 (three) times daily.   ondansetron 4 MG tablet Commonly known as:  ZOFRAN Take 1 tablet (4 mg total) by mouth every 8 (eight) hours as needed for nausea or vomiting.   predniSONE 10 MG (21) Tbpk tablet Commonly known as:  STERAPRED UNI-PAK 21 TAB 6 tabs PO x 1 day 5 tabs PO x 1 day 4 tabs PO x 1 day 3 tabs PO x 1 day 2 tabs PO x 1 day 1 tab PO x 1 day and stop   PRESERVISION AREDS PO Take 1 tablet by mouth daily.   simvastatin 40 MG tablet Commonly known as:  ZOCOR Take 40 mg by mouth every other day.   traZODone 50 MG tablet Commonly known as:  DESYREL Take 50 mg by mouth at bedtime.            Durable Medical Equipment        Start     Ordered   11/06/16 1059  For home use only DME oxygen  Once    Question Answer Comment  Mode or (Route) Nasal cannula   Liters per Minute 2   Frequency Continuous (stationary and portable oxygen unit needed)   Oxygen conserving device Yes   Oxygen delivery system Gas      11/06/16 1059       DISCHARGE INSTRUCTIONS:   1. PCP follow-up in 1-2 weeks  DIET:   Cardiac diet  ACTIVITY:   Activity as tolerated  OXYGEN:   Home Oxygen: No.  Oxygen Delivery: room air  DISCHARGE LOCATION:   home    If you experience worsening of your admission symptoms, develop shortness of breath, life threatening emergency, suicidal or homicidal thoughts you must seek medical attention immediately by calling 911 or calling your MD immediately  if symptoms less severe.  You Must read complete instructions/literature along with all the possible adverse reactions/side effects for all the Medicines you take and that have been prescribed to you. Take any new Medicines after you have completely understood and accpet all the possible adverse reactions/side effects.   Please note  You were cared for by a hospitalist during your hospital stay. If you have any questions about your discharge medications or the care you received while you were in the hospital after you are discharged, you can call the unit and asked to speak with the hospitalist on call if the hospitalist that took care of you is not  available. Once you are discharged, your primary care physician will handle any further medical issues. Please note that NO REFILLS for any discharge medications will be authorized once you are discharged, as it is imperative that you return to your primary care physician (or establish a relationship with a primary care physician if you do not have one) for your aftercare needs so that they can reassess your need for medications and monitor your lab values.    On the day of Discharge:  VITAL SIGNS:   Blood pressure 135/61, pulse 69, temperature 97.3 F (36.3 C), temperature source Oral, resp. rate 16, height 5\' 10"  (1.778 m), weight 99.8 kg (220 lb), SpO2 (!) 89 %.  PHYSICAL EXAMINATION:    GENERAL:  82 y.o.-year-old patient lying in the bed with no acute distress.  EYES: Pupils equal, round, reactive to light and accommodation. No scleral icterus. Extraocular muscles intact.  HEENT: Head atraumatic, normocephalic. Oropharynx and nasopharynx clear.  NECK:  Supple, no jugular venous distention. No thyroid  enlargement, no tenderness.  LUNGS: Normal breath sounds bilaterally, no wheezing, rales,rhonchi or crepitation. No use of accessory muscles of respiration. Decreased bibasilar breath sounds CARDIOVASCULAR: S1, S2 normal. No  rubs, or gallops. 2/6 systolic murmur is present ABDOMEN: Soft, nontender, nondistended. Bowel sounds present. No organomegaly or mass.  EXTREMITIES: No pedal edema, cyanosis, or clubbing.  NEUROLOGIC: Cranial nerves II through XII are intact. Muscle strength 5/5 in all extremities. Sensation intact. Gait not checked.  PSYCHIATRIC: The patient is alert and oriented x 3.  SKIN: No obvious lesion, or ulcer. Pruritus take macular rash and upper back with scabs from scratching. Looks like eczema  DATA REVIEW:   CBC  Recent Labs Lab 11/05/16 0514  WBC 8.5  HGB 16.7  HCT 48.0  PLT 116*    Chemistries   Recent Labs Lab 11/03/16 2038  11/06/16 0714  NA 138  < > 135  K 3.7  < > 3.8  CL 103  < > 96*  CO2 26  < > 32  GLUCOSE 130*  < > 116*  BUN 19  < > 31*  CREATININE 1.03  < > 1.15  CALCIUM 9.1  < > 8.8*  AST 24  --   --   ALT 17  --   --   ALKPHOS 69  --   --   BILITOT 1.8*  --   --   < > = values in this interval not displayed.   Microbiology Results  Results for orders placed or performed during the hospital encounter of 11/03/16  CULTURE, BLOOD (ROUTINE X 2) w Reflex to ID Panel     Status: None (Preliminary result)   Collection Time: 11/03/16  8:38 PM  Result Value Ref Range Status   Specimen Description BLOOD R AC  Final   Special Requests BOTTLES DRAWN AEROBIC AND ANAEROBIC BCAV  Final   Culture NO GROWTH 3 DAYS  Final   Report Status PENDING  Incomplete  CULTURE, BLOOD (ROUTINE X 2) w Reflex to ID Panel     Status: None (Preliminary result)   Collection Time: 11/03/16  8:38 PM  Result Value Ref Range Status   Specimen Description BLOOD R HAND  Final   Special Requests BOTTLES DRAWN AEROBIC AND ANAEROBIC BCAV  Final   Culture NO GROWTH 3  DAYS  Final   Report Status PENDING  Incomplete    RADIOLOGY:  Ct Angio Chest Pe W Or Wo Contrast  Result Date: 11/05/2016 CLINICAL DATA:  83 year old male with shortness of breath, hypoxia and wheezing today. EXAM: CT ANGIOGRAPHY CHEST WITH CONTRAST TECHNIQUE: Multidetector CT imaging of the chest was performed using the standard protocol during bolus administration of intravenous contrast. Multiplanar CT image reconstructions and MIPs were obtained to evaluate the vascular anatomy. CONTRAST:  75 cc intravenous Isovue 370 COMPARISON:  11/05/2016 and prior chest radiographs.  04/22/2015 CT FINDINGS: Cardiovascular: This is a technically satisfactory study. No pulmonary emboli are identified. Cardiomegaly and mild to moderate coronary artery calcifications noted. There is no evidence of pericardial effusion. Thoracic aortic atherosclerotic calcifications are noted without aneurysm. Mediastinum/Nodes: No enlarged mediastinal, hilar, or axillary lymph nodes. Thyroid gland, trachea, and esophagus demonstrate no significant findings. Lungs/Pleura: Bilateral lower lung atelectasis identified, moderate on the left and mild on the right. No definite airspace disease noted. No suspicious pulmonary mass noted. There is no evidence of pleural effusion or pneumothorax. Right pleural calcifications noted. Upper Abdomen: No acute abnormality Musculoskeletal: No chest wall abnormality. No acute or significant osseous findings. Review of the MIP images confirms the above findings. IMPRESSION: No evidence of pulmonary emboli or thoracic aortic aneurysm. Cardiomegaly and coronary artery disease. Bilateral lower lung atelectasis, moderate on the left and mild on the right. Electronically Signed   By: Margarette Canada M.D.   On: 11/05/2016 15:42     Management plans discussed with the patient, family and they are in agreement.  CODE STATUS:     Code Status Orders        Start     Ordered   11/04/16 0013  Full code   Continuous     11/04/16 0012    Code Status History    Date Active Date Inactive Code Status Order ID Comments User Context   This patient has a current code status but no historical code status.    Advance Directive Documentation     Most Recent Value  Type of Advance Directive  Healthcare Power of Attorney  Pre-existing out of facility DNR order (yellow form or pink MOST form)  -  "MOST" Form in Place?  -      TOTAL TIME TAKING CARE OF THIS PATIENT: 38 minutes.    Gladstone Lighter M.D on 11/06/2016 at 10:59 AM  Between 7am to 6pm - Pager - [phone]  After 6pm go to [email or web address] - Proofreader  [facility]  Hospitalists  Office  [phone]  CC: Primary care physician; Tracie Harrier, MD   Note: This dictation was prepared with Dragon dictation along with smaller phrase technology. Any transcriptional errors that result from this process are unintentional.

## 2016-11-06 NOTE — Care Management (Signed)
Qualified for home O2. Ordered from [facility].

## 2016-11-06 NOTE — Progress Notes (Signed)
SATURATION QUALIFICATIONS: (This note is used to comply with regulatory documentation for home oxygen)  Patient Saturations on Room Air at Rest = 90%  Patient Saturations on Room Air while Ambulating =86%  Patient Saturations on 2 Liters of oxygen while Ambulating = 90%  Please briefly explain why patient needs home oxygen: pt may need O2 at home while ambulating.

## 2016-11-06 NOTE — Progress Notes (Signed)
Pt walked with PT using cane, home O2 qualification completed as well. Pt was not sob with ambulation.

## 2016-11-06 NOTE — Care Management Note (Signed)
Case Management Note  Patient Details  Name: Jesse Meadows MRN: [id number] Date of Birth: 07/07/1926  Subjective/Objective:    Discharging today with RN, PT                Action/Plan: Advanced notified of discharge. Primary RN checking O2 sats for home O2.   Expected Discharge Date:                  Expected Discharge Plan:  [place]  In-House Referral:     Discharge planning Services  CM Consult  Post Acute Care Choice:  Home Health Choice offered to:  Adult Children  DME Arranged:    DME Agency:     HH Arranged:  RN, PT [place] Agency:  [facility]  Status of Service:  Completed, signed off  If discussed at [employer] of Stay Meetings, dates discussed:    Additional Comments:  Jolly Mango, RN 11/06/2016, 9:22 AM

## 2016-11-08 DIAGNOSIS — Z87891 Personal history of nicotine dependence: Secondary | ICD-10-CM | POA: Diagnosis not present

## 2016-11-08 DIAGNOSIS — G4730 Sleep apnea, unspecified: Secondary | ICD-10-CM | POA: Diagnosis not present

## 2016-11-08 DIAGNOSIS — Z9981 Dependence on supplemental oxygen: Secondary | ICD-10-CM | POA: Diagnosis not present

## 2016-11-08 DIAGNOSIS — L89322 Pressure ulcer of left buttock, stage 2: Secondary | ICD-10-CM | POA: Diagnosis not present

## 2016-11-08 DIAGNOSIS — J189 Pneumonia, unspecified organism: Secondary | ICD-10-CM | POA: Diagnosis not present

## 2016-11-08 DIAGNOSIS — R21 Rash and other nonspecific skin eruption: Secondary | ICD-10-CM | POA: Diagnosis not present

## 2016-11-08 DIAGNOSIS — I110 Hypertensive heart disease with heart failure: Secondary | ICD-10-CM | POA: Diagnosis not present

## 2016-11-08 DIAGNOSIS — I5022 Chronic systolic (congestive) heart failure: Secondary | ICD-10-CM | POA: Diagnosis not present

## 2016-11-08 DIAGNOSIS — M1611 Unilateral primary osteoarthritis, right hip: Secondary | ICD-10-CM | POA: Diagnosis not present

## 2016-11-08 DIAGNOSIS — H353130 Nonexudative age-related macular degeneration, bilateral, stage unspecified: Secondary | ICD-10-CM | POA: Diagnosis not present

## 2016-11-08 DIAGNOSIS — K219 Gastro-esophageal reflux disease without esophagitis: Secondary | ICD-10-CM | POA: Diagnosis not present

## 2016-11-08 LAB — CULTURE, BLOOD (ROUTINE X 2)
CULTURE: NO GROWTH
Culture: NO GROWTH

## 2016-11-13 DIAGNOSIS — Z9981 Dependence on supplemental oxygen: Secondary | ICD-10-CM | POA: Diagnosis not present

## 2016-11-13 DIAGNOSIS — I110 Hypertensive heart disease with heart failure: Secondary | ICD-10-CM | POA: Diagnosis not present

## 2016-11-13 DIAGNOSIS — J189 Pneumonia, unspecified organism: Secondary | ICD-10-CM | POA: Diagnosis not present

## 2016-11-13 DIAGNOSIS — K219 Gastro-esophageal reflux disease without esophagitis: Secondary | ICD-10-CM | POA: Diagnosis not present

## 2016-11-13 DIAGNOSIS — R21 Rash and other nonspecific skin eruption: Secondary | ICD-10-CM | POA: Diagnosis not present

## 2016-11-13 DIAGNOSIS — M1611 Unilateral primary osteoarthritis, right hip: Secondary | ICD-10-CM | POA: Diagnosis not present

## 2016-11-13 DIAGNOSIS — I5022 Chronic systolic (congestive) heart failure: Secondary | ICD-10-CM | POA: Diagnosis not present

## 2016-11-13 DIAGNOSIS — G4730 Sleep apnea, unspecified: Secondary | ICD-10-CM | POA: Diagnosis not present

## 2016-11-13 DIAGNOSIS — Z87891 Personal history of nicotine dependence: Secondary | ICD-10-CM | POA: Diagnosis not present

## 2016-11-13 DIAGNOSIS — H353130 Nonexudative age-related macular degeneration, bilateral, stage unspecified: Secondary | ICD-10-CM | POA: Diagnosis not present

## 2016-11-13 DIAGNOSIS — L89322 Pressure ulcer of left buttock, stage 2: Secondary | ICD-10-CM | POA: Diagnosis not present

## 2016-11-16 DIAGNOSIS — I10 Essential (primary) hypertension: Secondary | ICD-10-CM | POA: Diagnosis not present

## 2016-11-16 DIAGNOSIS — Z09 Encounter for follow-up examination after completed treatment for conditions other than malignant neoplasm: Secondary | ICD-10-CM | POA: Diagnosis not present

## 2016-11-16 DIAGNOSIS — G4733 Obstructive sleep apnea (adult) (pediatric): Secondary | ICD-10-CM | POA: Diagnosis not present

## 2016-11-16 DIAGNOSIS — D696 Thrombocytopenia, unspecified: Secondary | ICD-10-CM | POA: Diagnosis not present

## 2016-11-16 DIAGNOSIS — M150 Primary generalized (osteo)arthritis: Secondary | ICD-10-CM | POA: Diagnosis not present

## 2016-11-16 DIAGNOSIS — E7800 Pure hypercholesterolemia, unspecified: Secondary | ICD-10-CM | POA: Diagnosis not present

## 2016-11-19 DIAGNOSIS — L89322 Pressure ulcer of left buttock, stage 2: Secondary | ICD-10-CM | POA: Diagnosis not present

## 2016-11-19 DIAGNOSIS — I5022 Chronic systolic (congestive) heart failure: Secondary | ICD-10-CM | POA: Diagnosis not present

## 2016-11-19 DIAGNOSIS — I110 Hypertensive heart disease with heart failure: Secondary | ICD-10-CM | POA: Diagnosis not present

## 2016-11-19 DIAGNOSIS — J189 Pneumonia, unspecified organism: Secondary | ICD-10-CM | POA: Diagnosis not present

## 2016-12-01 DIAGNOSIS — I5022 Chronic systolic (congestive) heart failure: Secondary | ICD-10-CM | POA: Diagnosis not present

## 2016-12-01 DIAGNOSIS — R21 Rash and other nonspecific skin eruption: Secondary | ICD-10-CM | POA: Diagnosis not present

## 2016-12-01 DIAGNOSIS — K219 Gastro-esophageal reflux disease without esophagitis: Secondary | ICD-10-CM | POA: Diagnosis not present

## 2016-12-01 DIAGNOSIS — I110 Hypertensive heart disease with heart failure: Secondary | ICD-10-CM | POA: Diagnosis not present

## 2016-12-01 DIAGNOSIS — H353130 Nonexudative age-related macular degeneration, bilateral, stage unspecified: Secondary | ICD-10-CM | POA: Diagnosis not present

## 2016-12-01 DIAGNOSIS — Z9981 Dependence on supplemental oxygen: Secondary | ICD-10-CM | POA: Diagnosis not present

## 2016-12-01 DIAGNOSIS — M1611 Unilateral primary osteoarthritis, right hip: Secondary | ICD-10-CM | POA: Diagnosis not present

## 2016-12-01 DIAGNOSIS — L89322 Pressure ulcer of left buttock, stage 2: Secondary | ICD-10-CM | POA: Diagnosis not present

## 2016-12-01 DIAGNOSIS — Z87891 Personal history of nicotine dependence: Secondary | ICD-10-CM | POA: Diagnosis not present

## 2016-12-01 DIAGNOSIS — J189 Pneumonia, unspecified organism: Secondary | ICD-10-CM | POA: Diagnosis not present

## 2016-12-01 DIAGNOSIS — G4730 Sleep apnea, unspecified: Secondary | ICD-10-CM | POA: Diagnosis not present

## 2016-12-03 DIAGNOSIS — M150 Primary generalized (osteo)arthritis: Secondary | ICD-10-CM | POA: Diagnosis not present

## 2016-12-03 DIAGNOSIS — G4733 Obstructive sleep apnea (adult) (pediatric): Secondary | ICD-10-CM | POA: Diagnosis not present

## 2016-12-03 DIAGNOSIS — I10 Essential (primary) hypertension: Secondary | ICD-10-CM | POA: Diagnosis not present

## 2016-12-03 DIAGNOSIS — R972 Elevated prostate specific antigen [PSA]: Secondary | ICD-10-CM | POA: Diagnosis not present

## 2016-12-03 DIAGNOSIS — H6692 Otitis media, unspecified, left ear: Secondary | ICD-10-CM | POA: Diagnosis not present

## 2016-12-03 DIAGNOSIS — D696 Thrombocytopenia, unspecified: Secondary | ICD-10-CM | POA: Diagnosis not present

## 2016-12-03 DIAGNOSIS — R262 Difficulty in walking, not elsewhere classified: Secondary | ICD-10-CM | POA: Diagnosis not present

## 2016-12-03 DIAGNOSIS — Z0000 Encounter for general adult medical examination without abnormal findings: Secondary | ICD-10-CM | POA: Diagnosis not present

## 2016-12-03 DIAGNOSIS — F329 Major depressive disorder, single episode, unspecified: Secondary | ICD-10-CM | POA: Diagnosis not present

## 2016-12-03 DIAGNOSIS — E7800 Pure hypercholesterolemia, unspecified: Secondary | ICD-10-CM | POA: Diagnosis not present

## 2016-12-07 DIAGNOSIS — J189 Pneumonia, unspecified organism: Secondary | ICD-10-CM | POA: Diagnosis not present

## 2016-12-07 DIAGNOSIS — R0602 Shortness of breath: Secondary | ICD-10-CM | POA: Diagnosis not present

## 2016-12-07 DIAGNOSIS — A419 Sepsis, unspecified organism: Secondary | ICD-10-CM | POA: Diagnosis not present

## 2016-12-10 DIAGNOSIS — E7800 Pure hypercholesterolemia, unspecified: Secondary | ICD-10-CM | POA: Diagnosis not present

## 2016-12-10 DIAGNOSIS — I10 Essential (primary) hypertension: Secondary | ICD-10-CM | POA: Diagnosis not present

## 2016-12-10 DIAGNOSIS — M150 Primary generalized (osteo)arthritis: Secondary | ICD-10-CM | POA: Diagnosis not present

## 2016-12-10 DIAGNOSIS — N289 Disorder of kidney and ureter, unspecified: Secondary | ICD-10-CM | POA: Diagnosis not present

## 2016-12-10 DIAGNOSIS — R05 Cough: Secondary | ICD-10-CM | POA: Diagnosis not present

## 2016-12-15 DIAGNOSIS — L89322 Pressure ulcer of left buttock, stage 2: Secondary | ICD-10-CM | POA: Diagnosis not present

## 2016-12-15 DIAGNOSIS — I5022 Chronic systolic (congestive) heart failure: Secondary | ICD-10-CM | POA: Diagnosis not present

## 2016-12-15 DIAGNOSIS — M1611 Unilateral primary osteoarthritis, right hip: Secondary | ICD-10-CM | POA: Diagnosis not present

## 2016-12-15 DIAGNOSIS — J189 Pneumonia, unspecified organism: Secondary | ICD-10-CM | POA: Diagnosis not present

## 2016-12-15 DIAGNOSIS — H353130 Nonexudative age-related macular degeneration, bilateral, stage unspecified: Secondary | ICD-10-CM | POA: Diagnosis not present

## 2016-12-15 DIAGNOSIS — I110 Hypertensive heart disease with heart failure: Secondary | ICD-10-CM | POA: Diagnosis not present

## 2016-12-15 DIAGNOSIS — Z87891 Personal history of nicotine dependence: Secondary | ICD-10-CM | POA: Diagnosis not present

## 2016-12-15 DIAGNOSIS — Z9981 Dependence on supplemental oxygen: Secondary | ICD-10-CM | POA: Diagnosis not present

## 2016-12-15 DIAGNOSIS — G4730 Sleep apnea, unspecified: Secondary | ICD-10-CM | POA: Diagnosis not present

## 2016-12-15 DIAGNOSIS — K219 Gastro-esophageal reflux disease without esophagitis: Secondary | ICD-10-CM | POA: Diagnosis not present

## 2016-12-15 DIAGNOSIS — R21 Rash and other nonspecific skin eruption: Secondary | ICD-10-CM | POA: Diagnosis not present

## 2016-12-17 ENCOUNTER — Encounter
Admission: RE | Admit: 2016-12-17 | Discharge: 2016-12-17 | Disposition: A | Discharge status: Home / Self Care | Payer: PPO | Source: Ambulatory Visit | Attending: Orthopedic Surgery | Admitting: Orthopedic Surgery

## 2016-12-17 DIAGNOSIS — Z01812 Encounter for preprocedural laboratory examination: Secondary | ICD-10-CM | POA: Diagnosis not present

## 2016-12-17 HISTORY — DX: Pneumonia, unspecified organism: J18.9

## 2016-12-17 LAB — URINALYSIS, ROUTINE W REFLEX MICROSCOPIC
BACTERIA UA: NONE SEEN
Bilirubin Urine: NEGATIVE
Glucose, UA: NEGATIVE mg/dL
KETONES UR: NEGATIVE mg/dL
LEUKOCYTES UA: NEGATIVE
Nitrite: NEGATIVE
PH: 6 (ref 5.0–8.0)
PROTEIN: NEGATIVE mg/dL
SQUAMOUS EPITHELIAL / LPF: NONE SEEN
Specific Gravity, Urine: 1.005 (ref 1.005–1.030)

## 2016-12-17 LAB — PROTIME-INR
INR: 0.98
Prothrombin Time: 13 seconds (ref 11.4–15.2)

## 2016-12-17 LAB — CBC
HCT: 45.7 % (ref 40.0–52.0)
Hemoglobin: 15.7 g/dL (ref 13.0–18.0)
MCH: 32.1 pg (ref 26.0–34.0)
MCHC: 34.4 g/dL (ref 32.0–36.0)
MCV: 93.3 fL (ref 80.0–100.0)
PLATELETS: 171 10*3/uL (ref 150–440)
RBC: 4.9 MIL/uL (ref 4.40–5.90)
RDW: 15.6 % — AB (ref 11.5–14.5)
WBC: 8.3 10*3/uL (ref 3.8–10.6)

## 2016-12-17 LAB — BASIC METABOLIC PANEL
Anion gap: 12 (ref 5–15)
BUN: 24 mg/dL — AB (ref 6–20)
CALCIUM: 9.5 mg/dL (ref 8.9–10.3)
CO2: 29 mmol/L (ref 22–32)
CREATININE: 1.45 mg/dL — AB (ref 0.61–1.24)
Chloride: 100 mmol/L — ABNORMAL LOW (ref 101–111)
GFR calc Af Amer: 48 mL/min — ABNORMAL LOW (ref >60)
GFR, EST NON AFRICAN AMERICAN: 41 mL/min — AB (ref >60)
Glucose, Bld: 119 mg/dL — ABNORMAL HIGH (ref 65–99)
Potassium: 3.8 mmol/L (ref 3.5–5.1)
Sodium: 141 mmol/L (ref 135–145)

## 2016-12-17 LAB — SEDIMENTATION RATE: Sed Rate: 11 mm/hr (ref 0–20)

## 2016-12-17 LAB — APTT: aPTT: 32 seconds (ref 24–36)

## 2016-12-17 LAB — SURGICAL PCR SCREEN
MRSA, PCR: NEGATIVE
STAPHYLOCOCCUS AUREUS: NEGATIVE

## 2016-12-17 NOTE — Patient Instructions (Signed)
Your procedure is scheduled on: Wednesday 12/29/16 Report to [facility]. 2ND FLOOR MEDICAL MALL ENTRANCE. To find out your arrival time please call [phone] between 1PM - 3PM on Tuesday 12/28/16.  Remember: Instructions that are not followed completely may result in serious medical risk, up to and including death, or upon the discretion of your surgeon and anesthesiologist your surgery may need to be rescheduled.    __X__ 1. Do not eat food or drink liquids after midnight. No gum chewing or hard candies.     __X__ 2. No Alcohol for 24 hours before or after surgery.   ____ 3. Bring all medications with you on the day of surgery if instructed.    __X__ 4. Notify your doctor if there is any change in your medical condition     (cold, fever, infections).             ___X_5. No smoking within 24 hours of your surgery.     Do not wear jewelry, make-up, hairpins, clips or nail polish.  Do not wear lotions, powders, or perfumes.   Do not shave 48 hours prior to surgery. Men may shave face and neck.  Do not bring valuables to the hospital.    [facility] is not responsible for any belongings or valuables.               Contacts, dentures or bridgework may not be worn into surgery.  Leave your suitcase in the car. After surgery it may be brought to your room.  For patients admitted to the hospital, discharge time is determined by your                treatment team.   Patients discharged the day of surgery will not be allowed to drive home.   Please read over the following fact sheets that you were given:   MRSA Information   __X__ Take these medicines the morning of surgery with A SIP OF WATER:    1. AMLODIPINE  2. LOSARTAN  3.   4.  5.  6.  ____ Fleet Enema (as directed)   __X__ Use CHG Soap as directed  __X__ Use inhalers on the day of surgery  ____ Stop metformin 2 days prior to surgery    ____ Take 1/2 of usual insulin dose the night before surgery and none on the  morning of surgery.   ____ Stop Coumadin/Plavix/aspirin on   __X__ Stop Anti-inflammatories such as Advil, Aleve, Ibuprofen, Motrin, Naproxen, Naprosyn, Goodies,powder, or aspirin products.  OK to take Tylenol.   ____ Stop supplements until after surgery.    ____ Bring C-Pap to the hospital.

## 2016-12-18 DIAGNOSIS — L89322 Pressure ulcer of left buttock, stage 2: Secondary | ICD-10-CM | POA: Diagnosis not present

## 2016-12-18 DIAGNOSIS — I110 Hypertensive heart disease with heart failure: Secondary | ICD-10-CM | POA: Diagnosis not present

## 2016-12-18 DIAGNOSIS — J189 Pneumonia, unspecified organism: Secondary | ICD-10-CM | POA: Diagnosis not present

## 2016-12-18 DIAGNOSIS — I5022 Chronic systolic (congestive) heart failure: Secondary | ICD-10-CM | POA: Diagnosis not present

## 2016-12-18 LAB — URINE CULTURE: Culture: NO GROWTH

## 2016-12-18 LAB — TYPE AND SCREEN
ABO/RH(D): B POS
Antibody Screen: NEGATIVE

## 2016-12-21 ENCOUNTER — Encounter: Payer: Self-pay | Admitting: *Deleted

## 2016-12-21 NOTE — Pre-Procedure Instructions (Signed)
LABS COMPARED WITH 6/18

## 2016-12-28 MED ORDER — TRANEXAMIC ACID 1000 MG/10ML IV SOLN: 1000.0000 mg | INTRAVENOUS | Status: DC

## 2016-12-28 MED ORDER — CLINDAMYCIN PHOSPHATE 900 MG/50ML IV SOLN: 900.0000 mg | Freq: Once | INTRAVENOUS | Status: DC

## 2016-12-28 MED ORDER — CLINDAMYCIN PHOSPHATE 900 MG/50ML IV SOLN
900.0000 mg | Freq: Once | INTRAVENOUS | Status: AC
  Administered 2016-12-29: 900 mg via INTRAVENOUS

## 2016-12-29 ENCOUNTER — Inpatient Hospital Stay
Admission: RE | Admit: 2016-12-29 | Discharge: 2017-01-01 | DRG: 470 | Disposition: A | Discharge status: Skilled Nursing Facility | Payer: PPO | Source: Ambulatory Visit | Attending: Orthopedic Surgery | Admitting: Orthopedic Surgery

## 2016-12-29 ENCOUNTER — Inpatient Hospital Stay: Payer: PPO | Admitting: Anesthesiology

## 2016-12-29 ENCOUNTER — Inpatient Hospital Stay: Payer: PPO

## 2016-12-29 ENCOUNTER — Encounter
Admission: RE | Disposition: A | Discharge status: Skilled Nursing Facility | Payer: Self-pay | Source: Ambulatory Visit | Attending: Orthopedic Surgery

## 2016-12-29 ENCOUNTER — Encounter: Payer: Self-pay | Admitting: Neurology

## 2016-12-29 DIAGNOSIS — F3289 Other specified depressive episodes: Secondary | ICD-10-CM | POA: Diagnosis not present

## 2016-12-29 DIAGNOSIS — Z8249 Family history of ischemic heart disease and other diseases of the circulatory system: Secondary | ICD-10-CM | POA: Diagnosis not present

## 2016-12-29 DIAGNOSIS — E785 Hyperlipidemia, unspecified: Secondary | ICD-10-CM | POA: Diagnosis not present

## 2016-12-29 DIAGNOSIS — Z809 Family history of malignant neoplasm, unspecified: Secondary | ICD-10-CM | POA: Diagnosis not present

## 2016-12-29 DIAGNOSIS — Z888 Allergy status to other drugs, medicaments and biological substances status: Secondary | ICD-10-CM | POA: Diagnosis not present

## 2016-12-29 DIAGNOSIS — K219 Gastro-esophageal reflux disease without esophagitis: Secondary | ICD-10-CM | POA: Diagnosis present

## 2016-12-29 DIAGNOSIS — Z471 Aftercare following joint replacement surgery: Secondary | ICD-10-CM | POA: Diagnosis not present

## 2016-12-29 DIAGNOSIS — Z9849 Cataract extraction status, unspecified eye: Secondary | ICD-10-CM | POA: Diagnosis not present

## 2016-12-29 DIAGNOSIS — Z419 Encounter for procedure for purposes other than remedying health state, unspecified: Secondary | ICD-10-CM

## 2016-12-29 DIAGNOSIS — Z880 Allergy status to penicillin: Secondary | ICD-10-CM | POA: Diagnosis not present

## 2016-12-29 DIAGNOSIS — Z820 Family history of epilepsy and other diseases of the nervous system: Secondary | ICD-10-CM | POA: Diagnosis not present

## 2016-12-29 DIAGNOSIS — Z7982 Long term (current) use of aspirin: Secondary | ICD-10-CM

## 2016-12-29 DIAGNOSIS — G4730 Sleep apnea, unspecified: Secondary | ICD-10-CM | POA: Diagnosis not present

## 2016-12-29 DIAGNOSIS — N189 Chronic kidney disease, unspecified: Secondary | ICD-10-CM | POA: Diagnosis not present

## 2016-12-29 DIAGNOSIS — Z981 Arthrodesis status: Secondary | ICD-10-CM

## 2016-12-29 DIAGNOSIS — R251 Tremor, unspecified: Secondary | ICD-10-CM | POA: Diagnosis not present

## 2016-12-29 DIAGNOSIS — E1122 Type 2 diabetes mellitus with diabetic chronic kidney disease: Secondary | ICD-10-CM | POA: Diagnosis not present

## 2016-12-29 DIAGNOSIS — M1611 Unilateral primary osteoarthritis, right hip: Principal | ICD-10-CM | POA: Diagnosis present

## 2016-12-29 DIAGNOSIS — Z7401 Bed confinement status: Secondary | ICD-10-CM | POA: Diagnosis not present

## 2016-12-29 DIAGNOSIS — H3530 Unspecified macular degeneration: Secondary | ICD-10-CM | POA: Diagnosis not present

## 2016-12-29 DIAGNOSIS — G4733 Obstructive sleep apnea (adult) (pediatric): Secondary | ICD-10-CM | POA: Diagnosis present

## 2016-12-29 DIAGNOSIS — H409 Unspecified glaucoma: Secondary | ICD-10-CM | POA: Diagnosis present

## 2016-12-29 DIAGNOSIS — M25551 Pain in right hip: Secondary | ICD-10-CM | POA: Diagnosis present

## 2016-12-29 DIAGNOSIS — M6281 Muscle weakness (generalized): Secondary | ICD-10-CM | POA: Diagnosis not present

## 2016-12-29 DIAGNOSIS — I129 Hypertensive chronic kidney disease with stage 1 through stage 4 chronic kidney disease, or unspecified chronic kidney disease: Secondary | ICD-10-CM | POA: Diagnosis not present

## 2016-12-29 DIAGNOSIS — Z85828 Personal history of other malignant neoplasm of skin: Secondary | ICD-10-CM | POA: Diagnosis not present

## 2016-12-29 DIAGNOSIS — G8918 Other acute postprocedural pain: Secondary | ICD-10-CM

## 2016-12-29 DIAGNOSIS — R262 Difficulty in walking, not elsewhere classified: Secondary | ICD-10-CM | POA: Diagnosis not present

## 2016-12-29 DIAGNOSIS — R2681 Unsteadiness on feet: Secondary | ICD-10-CM | POA: Diagnosis not present

## 2016-12-29 DIAGNOSIS — I10 Essential (primary) hypertension: Secondary | ICD-10-CM | POA: Diagnosis not present

## 2016-12-29 DIAGNOSIS — Z96641 Presence of right artificial hip joint: Secondary | ICD-10-CM | POA: Diagnosis not present

## 2016-12-29 DIAGNOSIS — R6889 Other general symptoms and signs: Secondary | ICD-10-CM | POA: Diagnosis not present

## 2016-12-29 HISTORY — PX: TOTAL HIP ARTHROPLASTY: SHX124

## 2016-12-29 HISTORY — DX: Chronic kidney disease, unspecified: N18.9

## 2016-12-29 LAB — CBC
HCT: 40.7 % (ref 40.0–52.0)
Hemoglobin: 13.9 g/dL (ref 13.0–18.0)
MCH: 31.9 pg (ref 26.0–34.0)
MCHC: 34.2 g/dL (ref 32.0–36.0)
MCV: 93.3 fL (ref 80.0–100.0)
PLATELETS: 133 10*3/uL — AB (ref 150–440)
RBC: 4.37 MIL/uL — AB (ref 4.40–5.90)
RDW: 15.7 % — ABNORMAL HIGH (ref 11.5–14.5)
WBC: 11.8 10*3/uL — AB (ref 3.8–10.6)

## 2016-12-29 LAB — CREATININE, SERUM
Creatinine, Ser: 1.13 mg/dL (ref 0.61–1.24)
GFR, EST NON AFRICAN AMERICAN: 56 mL/min — AB (ref >60)

## 2016-12-29 SURGERY — ARTHROPLASTY, HIP, TOTAL, ANTERIOR APPROACH
Anesthesia: Spinal | Laterality: Right | Wound class: Clean

## 2016-12-29 MED ORDER — METOCLOPRAMIDE HCL 5 MG/ML IJ SOLN: 5.0000 mg | Freq: Three times a day (TID) | INTRAMUSCULAR | Status: DC | PRN

## 2016-12-29 MED ORDER — TRANEXAMIC ACID 1000 MG/10ML IV SOLN
INTRAVENOUS | Status: AC
  Filled 2016-12-29: qty 10

## 2016-12-29 MED ORDER — BUPIVACAINE HCL (PF) 0.5 % IJ SOLN
INTRAMUSCULAR | Status: DC | PRN
Start: 2016-12-29 — End: 2016-12-29

## 2016-12-29 MED ORDER — VITAMIN B-12 1000 MCG PO TABS
1000.0000 ug | ORAL_TABLET | Freq: Every day | ORAL | Status: DC
  Administered 2016-12-30 – 2017-01-01 (×3): 1000 ug via ORAL
  Filled 2016-12-29 (×3): qty 1

## 2016-12-29 MED ORDER — SODIUM CHLORIDE 0.9 % IV SOLN
INTRAVENOUS | Status: DC
  Administered 2016-12-29: 18:00:00 via INTRAVENOUS

## 2016-12-29 MED ORDER — ONDANSETRON HCL 4 MG PO TABS: 4.0000 mg | ORAL_TABLET | Freq: Four times a day (QID) | ORAL | Status: DC | PRN

## 2016-12-29 MED ORDER — SODIUM CHLORIDE 0.9 % IV SOLN
INTRAVENOUS | Status: DC | PRN
  Administered 2016-12-29: 15 ug/min via INTRAVENOUS

## 2016-12-29 MED ORDER — TRAZODONE HCL 100 MG PO TABS
100.0000 mg | ORAL_TABLET | Freq: Every day | ORAL | Status: DC
  Administered 2016-12-29 – 2016-12-31 (×3): 100 mg via ORAL
  Filled 2016-12-29 (×3): qty 1

## 2016-12-29 MED ORDER — PROPOFOL 500 MG/50ML IV EMUL
INTRAVENOUS | Status: DC | PRN
  Administered 2016-12-29: 30 ug/kg/min via INTRAVENOUS
  Administered 2016-12-29: 25 ug/kg/min via INTRAVENOUS

## 2016-12-29 MED ORDER — FENTANYL CITRATE (PF) 100 MCG/2ML IJ SOLN
25.0000 ug | INTRAMUSCULAR | Status: DC | PRN
  Administered 2016-12-29 (×2): 25 ug via INTRAVENOUS

## 2016-12-29 MED ORDER — AMLODIPINE BESYLATE 5 MG PO TABS
5.0000 mg | ORAL_TABLET | Freq: Every day | ORAL | Status: DC
  Administered 2016-12-30 – 2016-12-31 (×2): 5 mg via ORAL
  Filled 2016-12-29 (×3): qty 1

## 2016-12-29 MED ORDER — LACTATED RINGERS IV SOLN
INTRAVENOUS | Status: DC
  Administered 2016-12-29 (×2): via INTRAVENOUS

## 2016-12-29 MED ORDER — PROPOFOL 500 MG/50ML IV EMUL
INTRAVENOUS | Status: AC
  Filled 2016-12-29: qty 50

## 2016-12-29 MED ORDER — MORPHINE SULFATE (PF) 2 MG/ML IV SOLN
2.0000 mg | INTRAVENOUS | Status: DC | PRN
  Administered 2016-12-29: 2 mg via INTRAVENOUS
  Filled 2016-12-29: qty 1

## 2016-12-29 MED ORDER — BUPIVACAINE-EPINEPHRINE 0.25% -1:200000 IJ SOLN
INTRAMUSCULAR | Status: DC | PRN
  Administered 2016-12-29: 30 mL

## 2016-12-29 MED ORDER — METOCLOPRAMIDE HCL 10 MG PO TABS: 5.0000 mg | ORAL_TABLET | Freq: Three times a day (TID) | ORAL | Status: DC | PRN

## 2016-12-29 MED ORDER — FUROSEMIDE 20 MG PO TABS
20.0000 mg | ORAL_TABLET | Freq: Every day | ORAL | Status: DC | PRN
  Administered 2016-12-31: 20 mg via ORAL
  Filled 2016-12-29: qty 1

## 2016-12-29 MED ORDER — METHOCARBAMOL 500 MG PO TABS: 500.0000 mg | ORAL_TABLET | Freq: Four times a day (QID) | ORAL | Status: DC | PRN

## 2016-12-29 MED ORDER — DIPHENHYDRAMINE HCL 12.5 MG/5ML PO ELIX: 12.5000 mg | ORAL_SOLUTION | ORAL | Status: DC | PRN

## 2016-12-29 MED ORDER — DOCUSATE SODIUM 100 MG PO CAPS
100.0000 mg | ORAL_CAPSULE | Freq: Two times a day (BID) | ORAL | Status: DC
  Administered 2016-12-29 – 2017-01-01 (×6): 100 mg via ORAL
  Filled 2016-12-29 (×5): qty 1

## 2016-12-29 MED ORDER — BUPIVACAINE HCL (PF) 0.5 % IJ SOLN
INTRAMUSCULAR | Status: DC | PRN
  Administered 2016-12-29: 3 mL

## 2016-12-29 MED ORDER — BISACODYL 10 MG RE SUPP
10.0000 mg | Freq: Every day | RECTAL | Status: DC | PRN
  Administered 2016-12-31 – 2017-01-01 (×2): 10 mg via RECTAL
  Filled 2016-12-29 (×2): qty 1

## 2016-12-29 MED ORDER — LOSARTAN POTASSIUM 50 MG PO TABS
100.0000 mg | ORAL_TABLET | Freq: Every day | ORAL | Status: DC
  Administered 2016-12-30 – 2016-12-31 (×2): 100 mg via ORAL
  Filled 2016-12-29 (×3): qty 2

## 2016-12-29 MED ORDER — FENTANYL CITRATE (PF) 100 MCG/2ML IJ SOLN
INTRAMUSCULAR | Status: AC
  Filled 2016-12-29: qty 2

## 2016-12-29 MED ORDER — EPHEDRINE SULFATE 50 MG/ML IJ SOLN: INTRAMUSCULAR | Status: DC | PRN

## 2016-12-29 MED ORDER — LIDOCAINE HCL (PF) 2 % IJ SOLN
INTRAMUSCULAR | Status: AC
  Filled 2016-12-29: qty 2

## 2016-12-29 MED ORDER — MAGNESIUM CITRATE PO SOLN
1.0000 | Freq: Once | ORAL | Status: DC | PRN
  Filled 2016-12-29: qty 296

## 2016-12-29 MED ORDER — PHENOL 1.4 % MT LIQD
1.0000 | OROMUCOSAL | Status: DC | PRN
  Filled 2016-12-29: qty 177

## 2016-12-29 MED ORDER — TRANEXAMIC ACID 1000 MG/10ML IV SOLN
INTRAVENOUS | Status: AC | PRN
  Administered 2016-12-29: 1000 mg via INTRAVENOUS

## 2016-12-29 MED ORDER — OXYCODONE HCL 5 MG PO TABS
5.0000 mg | ORAL_TABLET | ORAL | Status: DC | PRN
  Administered 2016-12-29: 5 mg via ORAL
  Administered 2016-12-29 – 2016-12-30 (×4): 10 mg via ORAL
  Administered 2016-12-31 (×2): 5 mg via ORAL
  Administered 2016-12-31 – 2017-01-01 (×2): 10 mg via ORAL
  Filled 2016-12-29: qty 2
  Filled 2016-12-29: qty 1
  Filled 2016-12-29: qty 2
  Filled 2016-12-29: qty 1
  Filled 2016-12-29 (×2): qty 2
  Filled 2016-12-29: qty 1
  Filled 2016-12-29 (×2): qty 2

## 2016-12-29 MED ORDER — FENTANYL CITRATE (PF) 100 MCG/2ML IJ SOLN
INTRAMUSCULAR | Status: DC | PRN
  Administered 2016-12-29: 50 ug via INTRAVENOUS
  Administered 2016-12-29: 25 ug via INTRAVENOUS

## 2016-12-29 MED ORDER — PROPOFOL 10 MG/ML IV BOLUS
INTRAVENOUS | Status: DC | PRN
Start: 2016-12-29 — End: 2016-12-29
  Administered 2016-12-29: 15 mg via INTRAVENOUS
  Administered 2016-12-29: 10 mg via INTRAVENOUS

## 2016-12-29 MED ORDER — FLUTICASONE FUROATE-VILANTEROL 100-25 MCG/INH IN AEPB
1.0000 | INHALATION_SPRAY | Freq: Every day | RESPIRATORY_TRACT | Status: DC
  Administered 2016-12-29 – 2017-01-01 (×4): 1 via RESPIRATORY_TRACT
  Filled 2016-12-29: qty 28

## 2016-12-29 MED ORDER — LATANOPROST 0.005 % OP SOLN
1.0000 [drp] | Freq: Every day | OPHTHALMIC | Status: DC
  Administered 2016-12-29 – 2016-12-31 (×3): 1 [drp] via OPHTHALMIC
  Filled 2016-12-29: qty 2.5

## 2016-12-29 MED ORDER — ACETAMINOPHEN 500 MG PO TABS
1000.0000 mg | ORAL_TABLET | Freq: Four times a day (QID) | ORAL | Status: AC
  Administered 2016-12-30 (×2): 1000 mg via ORAL
  Filled 2016-12-29 (×3): qty 2

## 2016-12-29 MED ORDER — ACETAMINOPHEN 650 MG RE SUPP: 650.0000 mg | Freq: Four times a day (QID) | RECTAL | Status: DC | PRN

## 2016-12-29 MED ORDER — EPHEDRINE SULFATE 50 MG/ML IJ SOLN
INTRAMUSCULAR | Status: DC | PRN
  Administered 2016-12-29: 10 mg via INTRAVENOUS

## 2016-12-29 MED ORDER — CLINDAMYCIN PHOSPHATE 900 MG/50ML IV SOLN
900.0000 mg | Freq: Four times a day (QID) | INTRAVENOUS | Status: AC
  Administered 2016-12-29 – 2016-12-30 (×3): 900 mg via INTRAVENOUS
  Filled 2016-12-29 (×4): qty 50

## 2016-12-29 MED ORDER — METHOCARBAMOL 1000 MG/10ML IJ SOLN
500.0000 mg | Freq: Four times a day (QID) | INTRAVENOUS | Status: DC | PRN
  Filled 2016-12-29: qty 5

## 2016-12-29 MED ORDER — ONDANSETRON HCL 4 MG/2ML IJ SOLN: 4.0000 mg | Freq: Once | INTRAMUSCULAR | Status: DC | PRN

## 2016-12-29 MED ORDER — NEOMYCIN-POLYMYXIN B GU 40-200000 IR SOLN
Status: DC | PRN
  Administered 2016-12-29: 2 mL

## 2016-12-29 MED ORDER — ENOXAPARIN SODIUM 40 MG/0.4ML ~~LOC~~ SOLN
40.0000 mg | SUBCUTANEOUS | Status: DC
  Administered 2016-12-30 – 2017-01-01 (×3): 40 mg via SUBCUTANEOUS
  Filled 2016-12-29 (×3): qty 0.4

## 2016-12-29 MED ORDER — MAGNESIUM HYDROXIDE 400 MG/5ML PO SUSP
30.0000 mL | Freq: Every day | ORAL | Status: DC | PRN
  Administered 2016-12-29 – 2016-12-31 (×3): 30 mL via ORAL
  Filled 2016-12-29 (×3): qty 30

## 2016-12-29 MED ORDER — ZOLPIDEM TARTRATE 5 MG PO TABS: 5.0000 mg | ORAL_TABLET | Freq: Every evening | ORAL | Status: DC | PRN

## 2016-12-29 MED ORDER — FAMOTIDINE 20 MG PO TABS
ORAL_TABLET | ORAL | Status: AC
  Administered 2016-12-29: 20 mg via ORAL
  Filled 2016-12-29: qty 1

## 2016-12-29 MED ORDER — SIMVASTATIN 20 MG PO TABS
40.0000 mg | ORAL_TABLET | Freq: Every day | ORAL | Status: DC
  Administered 2016-12-29 – 2016-12-31 (×3): 40 mg via ORAL
  Filled 2016-12-29 (×3): qty 2

## 2016-12-29 MED ORDER — FAMOTIDINE 20 MG PO TABS
20.0000 mg | ORAL_TABLET | Freq: Once | ORAL | Status: AC
  Administered 2016-12-29: 20 mg via ORAL

## 2016-12-29 MED ORDER — ONDANSETRON HCL 4 MG/2ML IJ SOLN: 4.0000 mg | Freq: Four times a day (QID) | INTRAMUSCULAR | Status: DC | PRN

## 2016-12-29 MED ORDER — MENTHOL 3 MG MT LOZG
1.0000 | LOZENGE | OROMUCOSAL | Status: DC | PRN
  Filled 2016-12-29: qty 9

## 2016-12-29 MED ORDER — GABAPENTIN 600 MG PO TABS
600.0000 mg | ORAL_TABLET | Freq: Every day | ORAL | Status: DC
  Administered 2016-12-29 – 2016-12-31 (×3): 600 mg via ORAL
  Filled 2016-12-29 (×4): qty 1

## 2016-12-29 MED ORDER — BUPIVACAINE HCL (PF) 0.5 % IJ SOLN
INTRAMUSCULAR | Status: AC
  Filled 2016-12-29: qty 10

## 2016-12-29 MED ORDER — OCUVITE-LUTEIN PO TABS
1.0000 | ORAL_TABLET | Freq: Every day | ORAL | Status: DC
  Administered 2016-12-29 – 2017-01-01 (×4): 1 via ORAL
  Filled 2016-12-29 (×4): qty 1

## 2016-12-29 MED ORDER — ACETAMINOPHEN 325 MG PO TABS: 650.0000 mg | ORAL_TABLET | Freq: Four times a day (QID) | ORAL | Status: DC | PRN

## 2016-12-29 SURGICAL SUPPLY — 50 items
BLADE SAW SAG 18.5X105 (BLADE) ×3 IMPLANT
BNDG COHESIVE 6X5 TAN STRL LF (GAUZE/BANDAGES/DRESSINGS) ×9 IMPLANT
CANISTER SUCT 1200ML W/VALVE (MISCELLANEOUS) ×3 IMPLANT
CAPT HIP TOTAL 3 ×2 IMPLANT
CATH FOL LEG HOLDER (MISCELLANEOUS) ×3 IMPLANT
CATH TRAY METER 16FR LF (MISCELLANEOUS) ×3 IMPLANT
CHLORAPREP W/TINT 26ML (MISCELLANEOUS) ×3 IMPLANT
DRAPE C-ARM XRAY 36X54 (DRAPES) ×3 IMPLANT
DRAPE INCISE IOBAN 66X60 STRL (DRAPES) IMPLANT
DRAPE POUCH INSTRU U-SHP 10X18 (DRAPES) ×3 IMPLANT
DRAPE SHEET LG 3/4 BI-LAMINATE (DRAPES) ×9 IMPLANT
DRAPE TABLE BACK 80X90 (DRAPES) ×3 IMPLANT
DRESSING SURGICEL FIBRLLR 1X2 (HEMOSTASIS) ×2 IMPLANT
DRSG OPSITE POSTOP 4X8 (GAUZE/BANDAGES/DRESSINGS) ×6 IMPLANT
DRSG SURGICEL FIBRILLAR 1X2 (HEMOSTASIS) ×6
ELECT BLADE 6.5 EXT (BLADE) ×3 IMPLANT
ELECT REM PT RETURN 9FT ADLT (ELECTROSURGICAL) ×3
ELECTRODE REM PT RTRN 9FT ADLT (ELECTROSURGICAL) ×1 IMPLANT
GLOVE BIOGEL PI IND STRL 9 (GLOVE) ×1 IMPLANT
GLOVE BIOGEL PI INDICATOR 9 (GLOVE) ×2
GLOVE SURG SYN 9.0  PF PI (GLOVE) ×4
GLOVE SURG SYN 9.0 PF PI (GLOVE) ×2 IMPLANT
GOWN SRG 2XL LVL 4 RGLN SLV (GOWNS) ×1 IMPLANT
GOWN STRL NON-REIN 2XL LVL4 (GOWNS) ×3
GOWN STRL REUS W/ TWL LRG LVL3 (GOWN DISPOSABLE) ×1 IMPLANT
GOWN STRL REUS W/TWL LRG LVL3 (GOWN DISPOSABLE) ×3
HEMOVAC 400CC 10FR (MISCELLANEOUS) IMPLANT
HOOD PEEL AWAY FLYTE STAYCOOL (MISCELLANEOUS) ×3 IMPLANT
KIT PREVENA INCISION MGT 13 (CANNISTER) ×3 IMPLANT
MAT BLUE FLOOR 46X72 FLO (MISCELLANEOUS) ×3 IMPLANT
NDL SAFETY 18GX1.5 (NEEDLE) ×3 IMPLANT
NEEDLE SPNL 18GX3.5 QUINCKE PK (NEEDLE) ×3 IMPLANT
NS IRRIG 1000ML POUR BTL (IV SOLUTION) ×3 IMPLANT
PACK HIP COMPR (MISCELLANEOUS) ×3 IMPLANT
SOL PREP PVP 2OZ (MISCELLANEOUS) ×3
SOLUTION PREP PVP 2OZ (MISCELLANEOUS) ×1 IMPLANT
SPONGE DRAIN TRACH 4X4 STRL 2S (GAUZE/BANDAGES/DRESSINGS) ×3 IMPLANT
STAPLER SKIN PROX 35W (STAPLE) ×3 IMPLANT
STRAP SAFETY BODY (MISCELLANEOUS) ×3 IMPLANT
SUT DVC 2 QUILL PDO  T11 36X36 (SUTURE) ×2
SUT DVC 2 QUILL PDO T11 36X36 (SUTURE) ×1 IMPLANT
SUT SILK 0 (SUTURE) ×3
SUT SILK 0 30XBRD TIE 6 (SUTURE) ×1 IMPLANT
SUT V-LOC 90 ABS DVC 3-0 CL (SUTURE) ×3 IMPLANT
SUT VIC AB 1 CT1 36 (SUTURE) ×3 IMPLANT
SYR 20CC LL (SYRINGE) ×3 IMPLANT
SYR 30ML LL (SYRINGE) ×3 IMPLANT
TAPE MICROFOAM 4IN (TAPE) ×3 IMPLANT
TOWEL OR 17X26 4PK STRL BLUE (TOWEL DISPOSABLE) ×3 IMPLANT
WND VAC CANISTER 500ML (MISCELLANEOUS) ×3 IMPLANT

## 2016-12-29 NOTE — Anesthesia Procedure Notes (Addendum)
Date/Time: 12/29/2016 2:15 PM Performed by: Jonna Clark Pre-anesthesia Checklist: Patient identified, Emergency Drugs available, Suction available, Patient being monitored and Timeout performed Patient Re-evaluated:Patient Re-evaluated prior to induction Oxygen Delivery Method: Nasal cannula Placement Confirmation: positive ETCO2

## 2016-12-29 NOTE — Anesthesia Procedure Notes (Signed)
Spinal  Start time: 12/29/2016 2:30 PM End time: 12/29/2016 2:40 PM Staffing Anesthesiologist: Gunnar Fusi Resident/CRNA: POPE, KIMBERLY Preanesthetic Checklist Completed: patient identified, site marked, surgical consent, pre-op evaluation, timeout performed, IV checked, risks and benefits discussed and monitors and equipment checked Spinal Block Patient position: sitting Prep: ChloraPrep Patient monitoring: heart rate, continuous pulse ox and blood pressure Approach: midline Location: L3-4 Injection technique: single-shot Needle Needle type: Pencan  Needle gauge: 24 G Needle length: 10 cm Needle insertion depth: 8 cm Assessment Sensory level: T4 Additional Notes Patient has history of spinal surgery, 5 inch lumbar surgical scar well healed (15 years ago).  2 attempts per myself, 1 per Dr Ronelle Nigh with success. + CSF. Good level, no complaints on incision

## 2016-12-29 NOTE — Op Note (Signed)
12/29/2016  4:22 PM  PATIENT:  Jesse Meadows  83 y.o. male  PRE-OPERATIVE DIAGNOSIS:  PRIMARY OSTEOARTHRITIS RIGHT HIP  POST-OPERATIVE DIAGNOSIS:  PRIMARY OSTEOARTHRITIS RIGHT HIP  PROCEDURE:  Procedure(s): TOTAL HIP ARTHROPLASTY ANTERIOR APPROACH (Right)  SURGEON: Laurene Footman, MD  ASSISTANTS: None  ANESTHESIA:   spinal  EBL:  Total I/O In: -  Out: 400 [Blood:400]  BLOOD ADMINISTERED:none  DRAINS: none   LOCAL MEDICATIONS USED:  MARCAINE     SPECIMEN:  Source of Specimen:  Right femoral head  DISPOSITION OF SPECIMEN:  PATHOLOGY  COUNTS:  YES  TOURNIQUET:  * No tourniquets in log *  IMPLANTS: [place] for standard collared stem with 25 Mpact DM cup with liner and M 28 mm metal head  DICTATION: .Dragon Dictation   The patient was brought to the operating room and after spinal anesthesia was obtained patient was placed on the operative table with the ipsilateral foot into the Medacta attachment, contralateral leg on a well-padded table. C-arm was brought in and preop template x-ray taken. After prepping and draping in usual sterile fashion appropriate patient identification and timeout procedures were completed. Anterior approach to the hip was obtained and centered over the greater trochanter and TFL muscle. The subcutaneous tissue was incised hemostasis being achieved by electrocautery. TFL fascia was incised and the muscle retracted laterally deep retractor placed. The lateral femoral circumflex vessels were identified and ligated. The anterior capsule was exposed and a capsulotomy performed. The neck was identified and a femoral neck cut carried out with a saw. The head was removed without difficulty and showed sclerotic femoral head and acetabulum. Reaming was carried out to 56 mm and a 56 mm cup trial gave appropriate tightness to the acetabular component a 56 DM cup was impacted into position. The leg was then externally rotated and ischiofemoral and pubofemoral  releases carried out. The femur was sequentially broached to a size 4, size 4 standard width S head trials were placed and the final components chosen. The 4 standard stem was inserted along with a M 28 mm head and 56 mm liner. The hip was reduced and was stable the wound was thoroughly irrigated and febrile I was placed along the posterior capsulotomy. The deep fascia was closed using a heavy Quill after infiltration of 30 cc of quarter percent Sensorcaine with epinephrine. TXA was injected into the joint. 3-0 v-loc subcutaneous closure followed by skin staples and incisional wound VAC -  PLAN OF CARE: Admit to inpatient

## 2016-12-29 NOTE — Transfer of Care (Signed)
Immediate Anesthesia Transfer of Care Note  Patient: Jesse Meadows  Procedure(s) Performed: Procedure(s): TOTAL HIP ARTHROPLASTY ANTERIOR APPROACH (Right)  Patient Location: PACU  Anesthesia Type:Spinal  Level of Consciousness: awake, alert  and oriented  Airway & Oxygen Therapy: Patient Spontanous Breathing and Patient connected to nasal cannula oxygen  Post-op Assessment: Report given to RN and Post -op Vital signs reviewed and stable  Post vital signs: Reviewed and stable  Last Vitals:  Vitals:   12/29/16 1042 12/29/16 1626  BP: (!) 152/77 (!) 106/53  Pulse: 70 63  Resp: 16 18  Temp: 36.8 C (!) 35.9 C    Last Pain:  Vitals:   12/29/16 1042  PainSc: 4          Complications: No apparent anesthesia complications

## 2016-12-29 NOTE — H&P (Signed)
Reviewed paper H+P, will be scanned into chart. Patient examined No changes noted.  

## 2016-12-29 NOTE — Anesthesia Post-op Follow-up Note (Cosign Needed)
Anesthesia QCDR form completed.        

## 2016-12-29 NOTE — Anesthesia Preprocedure Evaluation (Signed)
Anesthesia Evaluation  Patient identified by MRN, date of birth, ID band Patient awake    Reviewed: Allergy & Precautions, NPO status , Patient's Chart, lab work & pertinent test results  History of Anesthesia Complications (+) history of anesthetic complications (pt c/o constipation following back sugery)  Airway Mallampati: III       Dental  (+) Upper Dentures, Lower Dentures   Pulmonary sleep apnea , former smoker,           Cardiovascular hypertension, Pt. on medications      Neuro/Psych    GI/Hepatic Neg liver ROS, GERD  ,  Endo/Other  negative endocrine ROS  Renal/GU Renal InsufficiencyRenal disease     Musculoskeletal   Abdominal   Peds  Hematology   Anesthesia Other Findings   Reproductive/Obstetrics                             Anesthesia Physical Anesthesia Plan  ASA: III  Anesthesia Plan: Spinal   Post-op Pain Management:    Induction:   PONV Risk Score and Plan:   Airway Management Planned:   Additional Equipment:   Intra-op Plan:   Post-operative Plan:   Informed Consent: I have reviewed the patients History and Physical, chart, labs and discussed the procedure including the risks, benefits and alternatives for the proposed anesthesia with the patient or authorized representative who has indicated his/her understanding and acceptance.     Plan Discussed with:   Anesthesia Plan Comments:         Anesthesia Quick Evaluation

## 2016-12-30 LAB — BASIC METABOLIC PANEL
Anion gap: 6 (ref 5–15)
BUN: 20 mg/dL (ref 6–20)
CALCIUM: 8.6 mg/dL — AB (ref 8.9–10.3)
CO2: 29 mmol/L (ref 22–32)
CREATININE: 1.15 mg/dL (ref 0.61–1.24)
Chloride: 102 mmol/L (ref 101–111)
GFR calc non Af Amer: 55 mL/min — ABNORMAL LOW (ref >60)
GLUCOSE: 142 mg/dL — AB (ref 65–99)
Potassium: 4.6 mmol/L (ref 3.5–5.1)
Sodium: 137 mmol/L (ref 135–145)

## 2016-12-30 LAB — CBC
HEMATOCRIT: 35.6 % — AB (ref 40.0–52.0)
HEMOGLOBIN: 12.5 g/dL — AB (ref 13.0–18.0)
MCH: 32 pg (ref 26.0–34.0)
MCHC: 35.1 g/dL (ref 32.0–36.0)
MCV: 91.3 fL (ref 80.0–100.0)
Platelets: 136 10*3/uL — ABNORMAL LOW (ref 150–440)
RBC: 3.9 MIL/uL — ABNORMAL LOW (ref 4.40–5.90)
RDW: 15.3 % — AB (ref 11.5–14.5)
WBC: 8.8 10*3/uL (ref 3.8–10.6)

## 2016-12-30 NOTE — Evaluation (Signed)
Physical Therapy Evaluation Patient Details Name: Jesse Meadows MRN: [id number] DOB: [birth year] Today's Date: 12/30/2016   History of Present Illness  Pt is an 83 yo male diagnosed with primary OA of the R hip and is now s/p R THA with direct anterior approach.  PMH inculdes: skin CA of the nose, chronic kidney disease, GERD, HLD, HTN, macular degeneration with poor vision, PNA, and sleep apnea.     Clinical Impression  Pt presents with significant deficits in strength, transfers, mobility, gait, balance, and activity tolerance.  Pt required Max A with bed mobility tasks.  Pt able to stand from elevated EOB with Mod A but required several attempts with EOB raised slightly each time.  Pt able to perform 2-3 small side steps at EOB with RW and Min A for stability with poor B foot clearance from floor, mostly shuffled feet.  SpO2 on 4LO2/min 93-94% with HR in the 60s.  No adverse symptoms during session other than R hip pain.  Pt will benefit from PT services in a SNF setting upon discharge to address above deficits for decreased caregiver assistance and eventual safe return to prior living situation.      Follow Up Recommendations SNF    Equipment Recommendations  Other (comment) (TBD in next venue of care)    Recommendations for Other Services       Precautions / Restrictions Precautions Precautions: Fall Restrictions Weight Bearing Restrictions: Yes RLE Weight Bearing: Weight bearing as tolerated Other Position/Activity Restrictions: Direct anterior approach with R THA, no hip precautions      Mobility  Bed Mobility Overal bed mobility: Needs Assistance Bed Mobility: Supine to Sit;Sit to Supine     Supine to sit: Max assist Sit to supine: Max assist      Transfers Overall transfer level: Needs assistance Equipment used: Rolling walker (2 wheeled) Transfers: Sit to/from Stand Sit to Stand: Mod assist         General transfer comment: From elevated  EOB  Ambulation/Gait Ambulation/Gait assistance: Min assist Ambulation Distance (Feet): 2 Feet Assistive device: Rolling walker (2 wheeled) Gait Pattern/deviations: Step-to pattern;Antalgic   Gait velocity interpretation: Below normal speed for age/gender General Gait Details: Short side steps Left/Right at EOB with heavy use of BUEs, effortful with little clearance of either LE from the floor  Stairs            Wheelchair Mobility    Modified Rankin (Stroke Patients Only)       Balance Overall balance assessment: Needs assistance Sitting-balance support: No upper extremity supported Sitting balance-Leahy Scale: Good     Standing balance support: Bilateral upper extremity supported Standing balance-Leahy Scale: Fair                               Pertinent Vitals/Pain Pain Assessment: 0-10 Pain Score: 9  Pain Location: R hip Pain Descriptors / Indicators: Aching;Sore Pain Intervention(s): Premedicated before session;Monitored during session;Limited activity within patient's tolerance    Home Living Family/patient expects to be discharged to:: Private residence Living Arrangements: Children Available Help at Discharge: Family;Available PRN/intermittently Type of Home: House Home Access: Level entry     Home Layout: One level Home Equipment: Cane - single point      Prior Function Level of Independence: Independent with assistive device(s)         Comments: Mod Ind amb with SPC limited community distances with no fall history     Hand Dominance  Extremity/Trunk Assessment   Upper Extremity Assessment Upper Extremity Assessment: Overall WFL for tasks assessed    Lower Extremity Assessment Lower Extremity Assessment: Generalized weakness;RLE deficits/detail RLE: Unable to fully assess due to pain RLE Sensation:  (Sensation to light touch grossly intact to BLEs)       Communication   Communication: No difficulties   Cognition Arousal/Alertness: Awake/alert Behavior During Therapy: WFL for tasks assessed/performed Overall Cognitive Status: Within Functional Limits for tasks assessed                                        General Comments      Exercises Total Joint Exercises Ankle Circles/Pumps: AROM;Both;5 reps;10 reps Quad Sets: Strengthening;Both;5 reps;10 reps Gluteal Sets: Strengthening;Both;10 reps Hip ABduction/ADduction: AAROM;Right;5 reps Straight Leg Raises: AAROM;Right;5 reps Long Arc Quad: AROM;Both;5 reps   Assessment/Plan    PT Assessment Patient needs continued PT services  PT Problem List Decreased strength;Decreased activity tolerance;Decreased balance;Decreased knowledge of use of DME;Decreased mobility       PT Treatment Interventions DME instruction;Gait training;Functional mobility training;Neuromuscular re-education;Therapeutic activities;Therapeutic exercise;Balance training;Patient/family education    PT Goals (Current goals can be found in the Care Plan section)  Acute Rehab PT Goals Patient Stated Goal: To walk better PT Goal Formulation: With patient Time For Goal Achievement: 01/12/17 Potential to Achieve Goals: Good    Frequency BID   Barriers to discharge        Co-evaluation               AM-PAC PT "6 Clicks" Daily Activity  Outcome Measure Difficulty turning over in bed (including adjusting bedclothes, sheets and blankets)?: Total Difficulty moving from lying on back to sitting on the side of the bed? : Total Difficulty sitting down on and standing up from a chair with arms (e.g., wheelchair, bedside commode, etc,.)?: Total Help needed moving to and from a bed to chair (including a wheelchair)?: A Lot Help needed walking in hospital room?: Total Help needed climbing 3-5 steps with a railing? : Total 6 Click Score: 7    End of Session Equipment Utilized During Treatment: Gait belt;Oxygen Activity Tolerance: Patient limited  by fatigue;Patient limited by pain Patient left: in bed;with bed alarm set;with SCD's reapplied;with family/visitor present;with call bell/phone within reach Nurse Communication: Mobility status PT Visit Diagnosis: Other abnormalities of gait and mobility (R26.89);Muscle weakness (generalized) (M62.81)    Time: 1610-9604 PT Time Calculation (min) (ACUTE ONLY): 41 min   Charges:   PT Evaluation $PT Eval Low Complexity: 1 Procedure PT Treatments $Therapeutic Exercise: 8-22 mins   PT G Codes:        DRoyetta Asal PT, DPT 12/30/16, 11:26 AM

## 2016-12-30 NOTE — NC FL2 (Signed)
[place] LEVEL OF CARE SCREENING TOOL     IDENTIFICATION  Patient Name: Jesse Meadows Birthdate: 1926/08/06 Sex: male Admission Date (Current Location): 12/29/2016  [place] and [place] Number:  Engineering geologist and Address:  [facility], [address], [place], [place] [address]      Provider Number: [id number]  Attending Physician Name and Address:  Hessie Knows, MD  Relative Name and Phone Number:       Current Level of Care: Hospital Recommended Level of Care: [place] Prior Approval Number:    Date Approved/Denied:   PASRR Number:  [id number])  Discharge Plan: SNF    Current Diagnoses: Patient Active Problem List   Diagnosis Date Noted  . Primary localized osteoarthritis of right hip 12/29/2016  . Pressure injury of skin 11/04/2016  . Sepsis ([place]) 11/03/2016  . HCAP (healthcare-associated pneumonia) 11/03/2016  . HLD (hyperlipidemia) 11/03/2016  . DEHYDRATION 08/27/2010  . LABRYNTHITIS 08/27/2010  . INSOMNIA UNSPECIFIED 08/27/2010  . Essential hypertension 08/14/2010  . ALLERGIC RHINITIS CAUSE UNSPECIFIED 08/14/2010  . SCIATICA, RIGHT 08/14/2010    Orientation RESPIRATION BLADDER Height & Weight     Self, Time, Situation, Place  Normal Continent Weight: 225 lb (102.1 kg) Height:  5\' 10"  (177.8 cm)  BEHAVIORAL SYMPTOMS/MOOD NEUROLOGICAL BOWEL NUTRITION STATUS   (none)  (none) Continent Diet (Diet: Heart Healthy )  AMBULATORY STATUS COMMUNICATION OF NEEDS Skin   Extensive Assist Verbally Surgical wounds, Wound Vac (Incision: Right Hip. Provena wound vac. )                       Personal Care Assistance Level of Assistance  Bathing, Feeding, Dressing Bathing Assistance: Limited assistance Feeding assistance: Independent Dressing Assistance: Limited assistance     Functional Limitations Info  Sight, Hearing, Speech Sight Info: Impaired Hearing Info: Adequate Speech Info: Adequate     SPECIAL CARE FACTORS FREQUENCY  PT (By licensed PT), OT (By licensed OT)     PT Frequency:  (5) OT Frequency:  (5)            Contractures      Additional Factors Info  Code Status, Allergies Code Status Info:  (Full Code. ) Allergies Info:  (Beta Adrenergic Blockers, Penicillins)           Current Medications (12/30/2016):  This is the current hospital active medication list Current Facility-Administered Medications  Medication Dose Route Frequency Provider Last Rate Last Dose  . 0.9 %  sodium chloride infusion   Intravenous Continuous Hessie Knows, MD 20 mL/hr at 12/30/16 0342    . acetaminophen (TYLENOL) tablet 650 mg  650 mg Oral Q6H PRN Hessie Knows, MD       Or  . acetaminophen (TYLENOL) suppository 650 mg  650 mg Rectal Q6H PRN Hessie Knows, MD      . acetaminophen (TYLENOL) tablet 1,000 mg  1,000 mg Oral Q6H Hessie Knows, MD   1,000 mg at 12/30/16 0121  . amLODipine (NORVASC) tablet 5 mg  5 mg Oral Daily Hessie Knows, MD   5 mg at 12/30/16 0851  . beta carotene w/minerals (OCUVITE) tablet 1 tablet  1 tablet Oral Daily Hessie Knows, MD   1 tablet at 12/30/16 0851  . bisacodyl (DULCOLAX) suppository 10 mg  10 mg Rectal Daily PRN Hessie Knows, MD      . diphenhydrAMINE (BENADRYL) 12.5 MG/5ML elixir 12.5-25 mg  12.5-25 mg Oral Q4H PRN Hessie Knows, MD      .  docusate sodium (COLACE) capsule 100 mg  100 mg Oral BID Hessie Knows, MD   100 mg at 12/30/16 0850  . enoxaparin (LOVENOX) injection 40 mg  40 mg Subcutaneous Q24H Hessie Knows, MD   40 mg at 12/30/16 0714  . fluticasone furoate-vilanterol (BREO ELLIPTA) 100-25 MCG/INH 1 puff  1 puff Inhalation Daily Hessie Knows, MD   1 puff at 12/30/16 0851  . furosemide (LASIX) tablet 20 mg  20 mg Oral Daily PRN Hessie Knows, MD      . gabapentin (NEURONTIN) tablet 600 mg  600 mg Oral QHS Hessie Knows, MD   600 mg at 12/29/16 2058  . latanoprost (XALATAN) 0.005 % ophthalmic solution 1 drop  1 drop Both Eyes QHS  Hessie Knows, MD   1 drop at 12/29/16 2057  . losartan (COZAAR) tablet 100 mg  100 mg Oral Daily Hessie Knows, MD   100 mg at 12/30/16 0850  . magnesium citrate solution 1 Bottle  1 Bottle Oral Once PRN Hessie Knows, MD      . magnesium hydroxide (MILK OF MAGNESIA) suspension 30 mL  30 mL Oral Daily PRN Hessie Knows, MD   30 mL at 12/29/16 2056  . menthol-cetylpyridinium (CEPACOL) lozenge 3 mg  1 lozenge Oral PRN Hessie Knows, MD       Or  . phenol (CHLORASEPTIC) mouth spray 1 spray  1 spray Mouth/Throat PRN Hessie Knows, MD      . methocarbamol (ROBAXIN) tablet 500 mg  500 mg Oral Q6H PRN Hessie Knows, MD       Or  . methocarbamol (ROBAXIN) 500 mg in dextrose 5 % 50 mL IVPB  500 mg Intravenous Q6H PRN Hessie Knows, MD      . metoCLOPramide (REGLAN) tablet 5-10 mg  5-10 mg Oral Q8H PRN Hessie Knows, MD       Or  . metoCLOPramide (REGLAN) injection 5-10 mg  5-10 mg Intravenous Q8H PRN Hessie Knows, MD      . morphine 2 MG/ML injection 2 mg  2 mg Intravenous Q2H PRN Hessie Knows, MD   2 mg at 12/29/16 2052  . ondansetron (ZOFRAN) tablet 4 mg  4 mg Oral Q6H PRN Hessie Knows, MD       Or  . ondansetron [facility]) injection 4 mg  4 mg Intravenous Q6H PRN Hessie Knows, MD      . oxyCODONE (Oxy IR/ROXICODONE) immediate release tablet 5-10 mg  5-10 mg Oral Q3H PRN Hessie Knows, MD   10 mg at 12/30/16 0428  . simvastatin (ZOCOR) tablet 40 mg  40 mg Oral q1800 Hessie Knows, MD   40 mg at 12/29/16 2056  . traZODone (DESYREL) tablet 100 mg  100 mg Oral QHS Hessie Knows, MD   100 mg at 12/29/16 2058  . vitamin B-12 (CYANOCOBALAMIN) tablet 1,000 mcg  1,000 mcg Oral Daily Hessie Knows, MD   1,000 mcg at 12/30/16 0850  . zolpidem (AMBIEN) tablet 5 mg  5 mg Oral QHS PRN Hessie Knows, MD         Discharge Medications: Please see discharge summary for a list of discharge medications.  Relevant Imaging Results:  Relevant Lab Results:   Additional Information  (SSN:  [id number])  , Veronia Beets, LCSW

## 2016-12-30 NOTE — Care Management Note (Signed)
Case Management Note  Patient Details  Name: Jesse Meadows MRN: [id number] Date of Birth: [birth year]  Subjective/Objective:   [facility] consult received for discharge planning. POD # 1 right THA. PT pending will follow progression.               Action/Plan:   Expected Discharge Date:                  Expected Discharge Plan:     In-House Referral:     Discharge planning Services  CM Consult  Post Acute Care Choice:    Choice offered to:     DME Arranged:    DME Agency:     HH Arranged:    HH Agency:     Status of Service:  In process, will continue to follow  If discussed at Long Length of Stay Meetings, dates discussed:    Additional Comments:  Jolly Mango, RN 12/30/2016, 8:59 AM

## 2016-12-30 NOTE — Clinical Social Work Placement (Signed)
   CLINICAL SOCIAL WORK PLACEMENT  NOTE  Date:  12/30/2016  Patient Details  Name: Jesse Meadows MRN: [id number] Date of Birth: [birth year]  Clinical Social Work is seeking post-discharge placement for this patient at the [place] level of care (*CSW will initial, date and re-position this form in  chart as items are completed):  Yes   Patient/family provided with [facility] Work Department's list of facilities offering this level of care within the geographic area requested by the patient (or if unable, by the patient's family).  Yes   Patient/family informed of their freedom to choose among providers that offer the needed level of care, that participate in Medicare, Medicaid or managed care program needed by the patient, have an available bed and are willing to accept the patient.  Yes   Patient/family informed of [facility]'s ownership interest in [facility] and [facility], as well as of the fact that they are under no obligation to receive care at these facilities.  PASRR submitted to EDS on 12/30/16     PASRR number received on 12/30/16     Existing PASRR number confirmed on       FL2 transmitted to all facilities in geographic area requested by pt/family on 12/30/16     FL2 transmitted to all facilities within larger geographic area on       Patient informed that his/her managed care company has contracts with or will negotiate with certain facilities, including the following:        Yes   Patient/family informed of bed offers received.  Patient chooses bed at  ([facility] )     Physician recommends and patient chooses bed at      Patient to be transferred to   on  .  Patient to be transferred to facility by       Patient family notified on   of transfer.  Name of family member notified:        PHYSICIAN       Additional Comment:    _______________________________________________ , Veronia Beets, LCSW 12/30/2016, 3:14  PM

## 2016-12-30 NOTE — Clinical Social Work Note (Signed)
Clinical Social Work Assessment  Patient Details  Name: Jesse Meadows MRN: [id number] Date of Birth: Oct 16, 1926  Date of referral:  12/30/16               Reason for consult:  Facility Placement                Permission sought to share information with:  Chartered certified accountant granted to share information::  Yes, Verbal Permission Granted  Name::      Jesse::   Meadows   Relationship::     Contact Information:     Housing/Transportation Living arrangements for the past 2 months:  [place] of Information:  Patient Patient Interpreter Needed:  None Criminal Activity/Legal Involvement Pertinent to Current Situation/Hospitalization:  No - Comment as needed Significant Relationships:  Adult Children Lives with:  Adult Children Do you feel safe going back to the place where you live?  Yes Need for family participation in patient care:  Yes (Comment)  Care giving concerns:  Patient lives in a suite that is built on to his daughter's house Jesse Meadows in [place].     Social Worker assessment / plan:  Holiday representative (CSW) received SNF consult. PT is recommending SNF. CSW met with patient alone at bedside to discuss D/C plan. CSW introduced self and explained role of CSW department. Patient was alert and oriented X4 and was laying in the bed. Patient reported that his wife passed away in Jul 12, 2015 and he lives with his daughter Jesse Meadows. CSW explained SNF process and that Health Team will have to approve SNF. Patient is agreeable to SNF search in [facility]. FL2 complete and faxed out.   CSW presented bed offers to patient and he chose [facility]. [facility] liaison is aware of above. Health Team case manager is aware of above. CSW attempted to contact patient's daughter Jesse Meadows however she was not at home. CSW will continue to follow and assist as needed.    Employment status:  Retired Programmer, applications PT Recommendations:  [place] / Referral to community resources:  [place]  Patient/Family's Response to care:  Patient is agreeable to going to [facility] for rehab.   Patient/Family's Understanding of and Emotional Response to Diagnosis, Current Treatment, and Prognosis:  Patient was very pleasant and thanked CSW for assistance.   Emotional Assessment Appearance:  Appears stated age Attitude/Demeanor/Rapport:    Affect (typically observed):  Accepting, Adaptable, Pleasant Orientation:  Oriented to Self, Oriented to Place, Oriented to  Time, Oriented to Situation Alcohol / Substance use:  Not Applicable Psych involvement (Current and /or in the community):  No (Comment)  Discharge Needs  Concerns to be addressed:  Discharge Planning Concerns Readmission within the last 30 days:  No Current discharge risk:  Dependent with Mobility Barriers to Discharge:  Continued Medical Work up   [employer], Veronia Beets, LCSW 12/30/2016, 3:15 PM

## 2016-12-30 NOTE — Progress Notes (Signed)
  Subjective: 1 Day Post-Op Procedure(s) (LRB): TOTAL HIP ARTHROPLASTY ANTERIOR APPROACH (Right) Patient reports pain as moderate.   Patient seen in rounds with Dr. Rudene Christians. Patient is well, and has had no acute complaints or problems. He had some difficulty waking up with pain medication. His oxygen saturation was diminished, but was improving. Plan is to go Rehab after hospital stay. Negative for chest pain and shortness of breath Fever: no Gastrointestinal: Negative for nausea and vomiting  Objective: Vital signs in last 24 hours: Temp:  [96.6 F (35.9 C)-98.6 F (37 C)] 97.9 F (36.6 C) (07/18 0120) Pulse Rate:  [54-70] 64 (07/18 0120) Resp:  [14-20] 18 (07/18 0120) BP: (106-152)/(53-77) 128/56 (07/18 0120) SpO2:  [95 %-100 %] 98 % (07/18 0120) Weight:  [102.1 kg (225 lb)] 102.1 kg (225 lb) (07/17 1042)  Intake/Output from previous day:  Intake/Output Summary (Last 24 hours) at 12/30/16 0713 Last data filed at 12/30/16 0435  Gross per 24 hour  Intake             2400 ml  Output              960 ml  Net             1440 ml    Intake/Output this shift: No intake/output data recorded.  Labs:  Recent Labs  12/29/16 1843 12/30/16 0416  HGB 13.9 12.5*    Recent Labs  12/29/16 1843 12/30/16 0416  WBC 11.8* 8.8  RBC 4.37* 3.90*  HCT 40.7 35.6*  PLT 133* 136*    Recent Labs  12/29/16 1843 12/30/16 0416  NA  --  137  K  --  4.6  CL  --  102  CO2  --  29  BUN  --  20  CREATININE 1.13 1.15  GLUCOSE  --  142*  CALCIUM  --  8.6*   No results for input(s): LABPT, INR in the last 72 hours.   EXAM General - Patient is Alert and Confused Extremity - Intact pulses distally Dorsiflexion/Plantar flexion intact Compartment soft Dressing/Incision - clean, dry, wound VAC in place Motor Function - intact, moving foot and toes well on exam.   Past Medical History:  Diagnosis Date  . Cancer ([place])    skin cancer on the nose  . Chronic kidney disease    RENAL  INSUFF  . Complication of anesthesia    could not get bowels to move after back surgery hospitalized 9 days  . GERD (gastroesophageal reflux disease)   . HLD (hyperlipidemia)   . Hypertension   . Macular degeneration   . Pneumonia   . Sleep apnea     Assessment/Plan: 1 Day Post-Op Procedure(s) (LRB): TOTAL HIP ARTHROPLASTY ANTERIOR APPROACH (Right) Active Problems:   Primary localized osteoarthritis of right hip  Estimated body mass index is 32.28 kg/m as calculated from the following:   Height as of this encounter: 5\' 10"  (1.778 m).   Weight as of this encounter: 102.1 kg (225 lb). Advance diet Up with therapy D/C IV fluids  Discharge planning to rehabilitation. Watch oxygen levels. Caution with narcotics.  DVT Prophylaxis - Lovenox, Foot Pumps and TED hose Weight-Bearing as tolerated to right leg  Reche Dixon, PA-C Orthopaedic Surgery 12/30/2016, 7:13 AM

## 2016-12-30 NOTE — Progress Notes (Signed)
Physical Therapy Treatment Patient Details Name: Jesse Meadows MRN: [id number] DOB: 1926/12/29 Today's Date: 12/30/2016    History of Present Illness Pt is an 83 yo male diagnosed with primary OA of the R hip and is now s/p R THA with direct anterior approach.  PMH inculdes: skin CA of the nose, chronic kidney disease, GERD, HLD, HTN, macular degeneration with poor vision, PNA, and sleep apnea.     PT Comments    Pt presents with deficits in strength, transfers, mobility, gait, balance, and activity tolerance.  Pt declined all but supine therex this session secondary to R hip pain after AM session.  Pt very anxious with movements of RLE during supine therex and required frequent short therapeutic rest breaks but did improve somewhat with encouragement as session progressed.  Pt will benefit from PT services in a SNF setting upon discharge to address above deficits for decreased caregiver assistance and eventual safe return to prior living situation.     Follow Up Recommendations  SNF     Equipment Recommendations  Other (comment) (TBD in next venue of care)    Recommendations for Other Services       Precautions / Restrictions Precautions Precautions: Fall Restrictions Weight Bearing Restrictions: Yes RLE Weight Bearing: Weight bearing as tolerated Other Position/Activity Restrictions: Direct anterior approach with R THA, no hip precautions    Mobility  Bed Mobility Overal bed mobility: Needs Assistance Bed Mobility: Supine to Sit;Sit to Supine     Supine to sit: Max assist Sit to supine: Max assist   General bed mobility comments: Pt declined bed mobility this session secondary to R hip pain  Transfers Overall transfer level: Needs assistance Equipment used: Rolling walker (2 wheeled) Transfers: Sit to/from Stand Sit to Stand: Mod assist         General transfer comment: Declined this session  Ambulation/Gait Ambulation/Gait assistance: Min assist Ambulation  Distance (Feet): 2 Feet Assistive device: Rolling walker (2 wheeled) Gait Pattern/deviations: Step-to pattern;Antalgic   Gait velocity interpretation: Below normal speed for age/gender General Gait Details: Declined this session   Stairs            Wheelchair Mobility    Modified Rankin (Stroke Patients Only)       Balance Overall balance assessment: Needs assistance Sitting-balance support: No upper extremity supported Sitting balance-Leahy Scale: Good     Standing balance support: Bilateral upper extremity supported Standing balance-Leahy Scale: Fair                              Cognition Arousal/Alertness: Awake/alert Behavior During Therapy: WFL for tasks assessed/performed Overall Cognitive Status: Within Functional Limits for tasks assessed                                        Exercises Total Joint Exercises Ankle Circles/Pumps: AROM;Both;10 reps;15 reps Quad Sets: Strengthening;Both;10 reps;15 reps Gluteal Sets: Strengthening;Both;10 reps Short Arc Quad: Other (comment) (2 x 10 B with AROM on RLE and manual resistance on LLE) Heel Slides: AAROM;Right;5 reps;10 reps Hip ABduction/ADduction: Other (comment) (2 x 10 B with AAROM on RLE and manual resistance on LLE) Straight Leg Raises: Other (comment) (2 x 10 B with AAROM on RLE and AROM on LLE) Long Arc Quad: AROM;Both;5 reps Other Exercises Other Exercises: HEP education/review with B APs, QS, and GS x 10 each 5-6x/day  as tolerated    General Comments        Pertinent Vitals/Pain Pain Assessment: 0-10 Pain Score: 7  Pain Location: R hip Pain Descriptors / Indicators: Aching;Sore Pain Intervention(s): Limited activity within patient's tolerance;Premedicated before session    [place] expects to be discharged to:: Private residence Living Arrangements: Children Available Help at Discharge: Family;Available PRN/intermittently Type of Home:  House Home Access: Level entry   Home Layout: One level Home Equipment: Cane - single point      Prior Function Level of Independence: Independent with assistive device(s)      Comments: Mod Ind amb with SPC limited community distances with no fall history   PT Goals (current goals can now be found in the care plan section) Acute Rehab PT Goals Patient Stated Goal: To walk better PT Goal Formulation: With patient Time For Goal Achievement: 01/12/17 Potential to Achieve Goals: Good    Frequency    BID      PT Plan Current plan remains appropriate    Co-evaluation              AM-PAC PT "6 Clicks" Daily Activity  Outcome Measure  Difficulty turning over in bed (including adjusting bedclothes, sheets and blankets)?: Total Difficulty moving from lying on back to sitting on the side of the bed? : Total Difficulty sitting down on and standing up from a chair with arms (e.g., wheelchair, bedside commode, etc,.)?: Total Help needed moving to and from a bed to chair (including a wheelchair)?: Total Help needed walking in hospital room?: Total Help needed climbing 3-5 steps with a railing? : Total 6 Click Score: 6    End of Session Equipment Utilized During Treatment: Oxygen Activity Tolerance: Patient tolerated treatment well Patient left: in bed;with bed alarm set;with call bell/phone within reach;with family/visitor present;with SCD's reapplied Nurse Communication: Mobility status PT Visit Diagnosis: Other abnormalities of gait and mobility (R26.89);Muscle weakness (generalized) (M62.81)     Time: 1610-9604 PT Time Calculation (min) (ACUTE ONLY): 30 min  Charges:  $Therapeutic Exercise: 23-37 mins                    G Codes:       DRoyetta Asal PT, DPT 12/30/16, 3:06 PM

## 2016-12-30 NOTE — Anesthesia Postprocedure Evaluation (Signed)
Anesthesia Post Note  Patient: KINDRED REIDINGER  Procedure(s) Performed: Procedure(s) (LRB): TOTAL HIP ARTHROPLASTY ANTERIOR APPROACH (Right)  Patient location during evaluation: Nursing Unit Anesthesia Type: Spinal Level of consciousness: awake and alert and oriented Pain management: satisfactory to patient Vital Signs Assessment: post-procedure vital signs reviewed and stable Respiratory status: respiratory function stable Cardiovascular status: stable Postop Assessment: no backache, spinal receding, no headache, patient able to bend at knees, no signs of nausea or vomiting and adequate PO intake Anesthetic complications: no     Last Vitals:  Vitals:   12/29/16 2214 12/30/16 0120  BP: (!) 141/56 (!) 128/56  Pulse: 62 64  Resp: 18 18  Temp: 36.7 C 36.6 C    Last Pain:  Vitals:   12/30/16 0528  TempSrc:   PainSc: Ambrose Mantle

## 2016-12-31 LAB — SURGICAL PATHOLOGY

## 2016-12-31 MED ORDER — OXYCODONE HCL 5 MG PO TABS: 5.0000 mg | ORAL_TABLET | ORAL | 0 refills | Status: DC | PRN

## 2016-12-31 MED ORDER — ENOXAPARIN SODIUM 40 MG/0.4ML ~~LOC~~ SOLN: 40.0000 mg | SUBCUTANEOUS | 0 refills | Status: DC

## 2016-12-31 NOTE — Progress Notes (Signed)
Health Team SNF authorization has been received, auth # [id number]. Plan is for patient to D/C to [facility] tomorrow pending medical clearance. [facility] liaison is aware of above. Patient is aware of above. Clinical Social Worker (CSW) will continue to follow and assist as needed.   [employer], LCSW [phone]

## 2016-12-31 NOTE — Progress Notes (Signed)
  Subjective: 2 Days Post-Op Procedure(s) (LRB): TOTAL HIP ARTHROPLASTY ANTERIOR APPROACH (Right) Patient reports pain as moderate. Improving slightly.  Patient seen in rounds with Dr. Rudene Christians. Patient is well, and has had no acute complaints or problems.  Plan is to go Rehab after hospital stay. Plan for Friday. Negative for chest pain and shortness of breath Fever: no Gastrointestinal: Negative for nausea and vomiting  Objective: Vital signs in last 24 hours: Temp:  [98.5 F (36.9 C)-98.8 F (37.1 C)] 98.8 F (37.1 C) (07/18 2327) Pulse Rate:  [63-68] 66 (07/18 2327) Resp:  [19-20] 19 (07/18 2327) BP: (107-119)/(42-79) 111/42 (07/18 2327) SpO2:  [93 %-98 %] 93 % (07/19 0630)  Intake/Output from previous day:  Intake/Output Summary (Last 24 hours) at 12/31/16 0708 Last data filed at 12/30/16 2100  Gross per 24 hour  Intake              900 ml  Output              100 ml  Net              800 ml    Intake/Output this shift: No intake/output data recorded.  Labs:  Recent Labs  12/29/16 1843 12/30/16 0416  HGB 13.9 12.5*    Recent Labs  12/29/16 1843 12/30/16 0416  WBC 11.8* 8.8  RBC 4.37* 3.90*  HCT 40.7 35.6*  PLT 133* 136*    Recent Labs  12/29/16 1843 12/30/16 0416  NA  --  137  K  --  4.6  CL  --  102  CO2  --  29  BUN  --  20  CREATININE 1.13 1.15  GLUCOSE  --  142*  CALCIUM  --  8.6*   No results for input(s): LABPT, INR in the last 72 hours.   EXAM General - Patient is Alert and Confused Extremity - Intact pulses distally Dorsiflexion/Plantar flexion intact Compartment soft Dressing/Incision - clean, dry, wound VAC in place Motor Function - intact, moving foot and toes well on exam. Ambulated several feet.  Past Medical History:  Diagnosis Date  . Cancer ([place])    skin cancer on the nose  . Chronic kidney disease    RENAL INSUFF  . Complication of anesthesia    could not get bowels to move after back surgery hospitalized 9 days  .  GERD (gastroesophageal reflux disease)   . HLD (hyperlipidemia)   . Hypertension   . Macular degeneration   . Pneumonia   . Sleep apnea     Assessment/Plan: 2 Days Post-Op Procedure(s) (LRB): TOTAL HIP ARTHROPLASTY ANTERIOR APPROACH (Right) Active Problems:   Primary localized osteoarthritis of right hip  Estimated body mass index is 32.28 kg/m as calculated from the following:   Height as of this encounter: 5\' 10"  (1.778 m).   Weight as of this encounter: 102.1 kg (225 lb). Advance diet Up with therapy D/C IV fluids  Discharge planning to rehabilitation.Plan for Friday. Bowel management.  DVT Prophylaxis - Lovenox, Foot Pumps and TED hose Weight-Bearing as tolerated to right leg  Reche Dixon, PA-C Orthopaedic Surgery 12/31/2016, 7:08 AM

## 2016-12-31 NOTE — Progress Notes (Signed)
Physical Therapy Treatment Patient Details Name: Jesse Meadows MRN: [id number] DOB: July 16, 1926 Today's Date: 12/31/2016    History of Present Illness Pt is an 83 yo male diagnosed with primary OA of the R hip and is now s/p R THA with direct anterior approach.  PMH inculdes: skin CA of the nose, chronic kidney disease, GERD, HLD, HTN, macular degeneration with poor vision, PNA, and sleep apnea.     PT Comments    Pt agreeable to bed exercises only. Pt reports pain in R hip/thigh is 8/10. Pt lethargic requiring re awakening several times during exercises, but does awaken to participate well. Pt requires increased rest breaks and assist on Right lower extremity and at times on left. Pt notes pain with Right lower extremity movement, but range does improve with continued repetitions. Continue PT this afternoon for progression of strength, endurance and range and hopeful up/out of bed function.    Follow Up Recommendations  SNF     Equipment Recommendations       Recommendations for Other Services       Precautions / Restrictions Precautions Precautions: Fall Restrictions Weight Bearing Restrictions: Yes RLE Weight Bearing: Weight bearing as tolerated    Mobility  Bed Mobility               General bed mobility comments: Not tested; pt refuses out of bed this session due to pain  Transfers                    Ambulation/Gait                 Stairs            Wheelchair Mobility    Modified Rankin (Stroke Patients Only)       Balance                                            Cognition Arousal/Alertness: Lethargic (does awaken well for exercises) Behavior During Therapy: WFL for tasks assessed/performed Overall Cognitive Status: Within Functional Limits for tasks assessed                                        Exercises Total Joint Exercises Ankle Circles/Pumps: AROM;Both;20 reps;Supine Quad Sets:  Strengthening;Both;10 reps;Supine (2 sets) Gluteal Sets: Strengthening;Both;10 reps;Supine (2 sets) Short Arc Quad: AAROM;AROM;Both;20 reps;Supine (Initial AAROM R, progressed to AROM; AROM L) Heel Slides: AAROM;Right;20 reps;Supine (AROM L) Hip ABduction/ADduction: AAROM;20 reps;Supine;Both Straight Leg Raises: AAROM;Right;10 reps;Supine (Strengthening L)    General Comments        Pertinent Vitals/Pain Pain Assessment: 0-10 Pain Score: 8  Pain Location: R hip to the knee Pain Descriptors / Indicators: Constant;Aching;Sharp;Shooting    Home Living                      Prior Function            PT Goals (current goals can now be found in the care plan section) Progress towards PT goals: Progressing toward goals (slowly)    Frequency    BID      PT Plan Current plan remains appropriate    Co-evaluation              AM-PAC PT "6 Clicks" Daily Activity  Outcome Measure  Difficulty turning over in bed (including adjusting bedclothes, sheets and blankets)?: Total Difficulty moving from lying on back to sitting on the side of the bed? : Total Difficulty sitting down on and standing up from a chair with arms (e.g., wheelchair, bedside commode, etc,.)?: Total Help needed moving to and from a bed to chair (including a wheelchair)?: Total Help needed walking in hospital room?: Total Help needed climbing 3-5 steps with a railing? : Total 6 Click Score: 6    End of Session Equipment Utilized During Treatment: Oxygen Activity Tolerance: Patient tolerated treatment well Patient left: in bed;with call bell/phone within reach;with bed alarm set;with family/visitor present;with SCD's reapplied   PT Visit Diagnosis: Other abnormalities of gait and mobility (R26.89);Muscle weakness (generalized) (M62.81)     Time: 1225-8346 PT Time Calculation (min) (ACUTE ONLY): 36 min  Charges:  $Therapeutic Exercise: 23-37 mins                    G Codes:        Larae Grooms, PTA 12/31/2016, 10:37 AM

## 2016-12-31 NOTE — Discharge Instructions (Signed)
ANTERIOR APPROACH TOTAL HIP REPLACEMENT POSTOPERATIVE DIRECTIONS   Hip Rehabilitation, Guidelines Following Surgery  The results of a hip operation are greatly improved after range of motion and muscle strengthening exercises. Follow all safety measures which are given to protect your hip. If any of these exercises cause increased pain or swelling in your joint, decrease the amount until you are comfortable again. Then slowly increase the exercises. Call your caregiver if you have problems or questions.   HOME CARE INSTRUCTIONS  Remove items at home which could result in a fall. This includes throw rugs or furniture in walking pathways.   ICE to the affected hip every three hours for 30 minutes at a time and then as needed for pain and swelling.  Continue to use ice on the hip for pain and swelling from surgery. You may notice swelling that will progress down to the foot and ankle.  This is normal after surgery.  Elevate the leg when you are not up walking on it.    Continue to use the breathing machine which will help keep your temperature down.  It is common for your temperature to cycle up and down following surgery, especially at night when you are not up moving around and exerting yourself.  The breathing machine keeps your lungs expanded and your temperature down.  Do not place pillow under knee, focus on keeping the knee straight while resting  DIET You may resume your previous home diet once your are discharged from the hospital.  DRESSING / WOUND CARE / SHOWERING Keep the wound VAC on until the battery runs out, usually at 1 week. Change to a waterproof for surgical dry bandage. Staples will be removed at orthopedics in 2 weeks. Keep your dressing dry with showering.  You can keep it covered and pat dry. Change the surgical dressing daily and reapply a dry dressing each time.  ACTIVITY Walk with your walker as instructed. Use walker as long as suggested by your caregivers. Avoid  periods of inactivity such as sitting longer than an hour when not asleep. This helps prevent blood clots.  You may resume a sexual relationship in one month or when given the OK by your doctor.  You may return to work once you are cleared by your doctor.  Do not drive a car for 6 weeks or until released by you surgeon.  Do not drive while taking narcotics.  WEIGHT BEARING Weight bearing as tolerated with assist device (walker, cane, etc) as directed, use it as long as suggested by your surgeon or therapist, typically at least 4-6 weeks.  POSTOPERATIVE CONSTIPATION PROTOCOL Constipation - defined medically as fewer than three stools per week and severe constipation as less than one stool per week.  One of the most common issues patients have following surgery is constipation.  Even if you have a regular bowel pattern at home, your normal regimen is likely to be disrupted due to multiple reasons following surgery.  Combination of anesthesia, postoperative narcotics, change in appetite and fluid intake all can affect your bowels.  In order to avoid complications following surgery, here are some recommendations in order to help you during your recovery period.  Colace (docusate) - Pick up an over-the-counter form of Colace or another stool softener and take twice a day as long as you are requiring postoperative pain medications.  Take with a full glass of water daily.  If you experience loose stools or diarrhea, hold the colace until you stool forms back up.  If your symptoms do not get better within 1 week or if they get worse, check with your doctor.  Dulcolax (bisacodyl) - Pick up over-the-counter and take as directed by the product packaging as needed to assist with the movement of your bowels.  Take with a full glass of water.  Use this product as needed if not relieved by Colace only.   MiraLax (polyethylene glycol) - Pick up over-the-counter to have on hand.  MiraLax is a solution that will  increase the amount of water in your bowels to assist with bowel movements.  Take as directed and can mix with a glass of water, juice, soda, coffee, or tea.  Take if you go more than two days without a movement. Do not use MiraLax more than once per day. Call your doctor if you are still constipated or irregular after using this medication for 7 days in a row.  If you continue to have problems with postoperative constipation, please contact the office for further assistance and recommendations.  If you experience "the worst abdominal pain ever" or develop nausea or vomiting, please contact the office immediatly for further recommendations for treatment.  ITCHING  If you experience itching with your medications, try taking only a single pain pill, or even half a pain pill at a time.  You can also use Benadryl over the counter for itching or also to help with sleep.   TED HOSE STOCKINGS Wear the elastic stockings on both legs for three weeks following surgery during the day but you may remove then at night for sleeping.  MEDICATIONS See your medication summary on the After Visit Summary that the nursing staff will review with you prior to discharge.  You may have some home medications which will be placed on hold until you complete the course of blood thinner medication.  It is important for you to complete the blood thinner medication as prescribed by your surgeon.  Continue your approved medications as instructed at time of discharge.  PRECAUTIONS If you experience chest pain or shortness of breath - call 911 immediately for transfer to the hospital emergency department.  If you develop a fever greater that 101 F, purulent drainage from wound, increased redness or drainage from wound, foul odor from the wound/dressing, or calf pain - CONTACT YOUR SURGEON.                                                   FOLLOW-UP APPOINTMENTS Make sure you keep all of your appointments after your operation  with your surgeon and caregivers. You should call the office at the above phone number and make an appointment for approximately two weeks after the date of your surgery or on the date instructed by your surgeon outlined in the "After Visit Summary".  RANGE OF MOTION AND STRENGTHENING EXERCISES  These exercises are designed to help you keep full movement of your hip joint. Follow your caregiver's or physical therapist's instructions. Perform all exercises about fifteen times, three times per day or as directed. Exercise both hips, even if you have had only one joint replacement. These exercises can be done on a training (exercise) mat, on the floor, on a table or on a bed. Use whatever works the best and is most comfortable for you. Use music or television while you are exercising so that the  exercises are a pleasant break in your day. This will make your life better with the exercises acting as a break in routine you can look forward to.  Lying on your back, slowly slide your foot toward your buttocks, raising your knee up off the floor. Then slowly slide your foot back down until your leg is straight again.  Lying on your back spread your legs as far apart as you can without causing discomfort.  Lying on your side, raise your upper leg and foot straight up from the floor as far as is comfortable. Slowly lower the leg and repeat.  Lying on your back, tighten up the muscle in the front of your thigh (quadriceps muscles). You can do this by keeping your leg straight and trying to raise your heel off the floor. This helps strengthen the largest muscle supporting your knee.  Lying on your back, tighten up the muscles of your buttocks both with the legs straight and with the knee bent at a comfortable angle while keeping your heel on the floor.   IF YOU ARE TRANSFERRED TO A SKILLED REHAB FACILITY If the patient is transferred to a skilled rehab facility following release from the hospital, a list of the  current medications will be sent to the facility for the patient to continue.  When discharged from the skilled rehab facility, please have the facility set up the patient's [place] prior to being released. Also, the skilled facility will be responsible for providing the patient with their medications at time of release from the facility to include their pain medication, the muscle relaxants, and their blood thinner medication. If the patient is still at the rehab facility at time of the two week follow up appointment, the skilled rehab facility will also need to assist the patient in arranging follow up appointment in our office and any transportation needs.  MAKE SURE YOU:  Understand these instructions.  Get help right away if you are not doing well or get worse.    Pick up stool softner and laxative for home use following surgery while on pain medications. Do not submerge incision under water. Please use good hand washing techniques while changing dressing each day. May shower starting three days after surgery. Please use a clean towel to pat the incision dry following showers. Continue to use ice for pain and swelling after surgery. Do not use any lotions or creams on the incision until instructed by your surgeon.

## 2016-12-31 NOTE — Progress Notes (Signed)
Physical Therapy Treatment Patient Details Name: [facility] MRN: [id number] DOB: [birth year] Today's Date: 12/31/2016    History of Present Illness Pt is an 83 yo male diagnosed with primary OA of the R hip and is now s/p R THA with direct anterior approach.  PMH inculdes: skin CA of the nose, chronic kidney disease, GERD, HLD, HTN, macular degeneration with poor vision, PNA, and sleep apnea.     PT Comments    Pt sleeping initially, but awoken through voice. Pt continues to complains of 8/10 pain in Right lower extremity. Pt agreeable to bed exercises only again this session due to the pain. Difficulty moving Right lower extremity with noted stiffness, which does ease some with continued repetitions. Education provided again to pt throughout session on need for movement to avoid increased stiffness pain. Continue PT to progress participation, range, endurance and strength to improve functional mobility.    Follow Up Recommendations  SNF     Equipment Recommendations       Recommendations for Other Services       Precautions / Restrictions Precautions Precautions: Fall Restrictions Weight Bearing Restrictions: Yes RLE Weight Bearing: Weight bearing as tolerated    Mobility  Bed Mobility               General bed mobility comments: Not tested; pt refuses out of bed this session due to pain  Transfers                    Ambulation/Gait                 Stairs            Wheelchair Mobility    Modified Rankin (Stroke Patients Only)       Balance                                            Cognition Arousal/Alertness: Lethargic (awakens through voice) Behavior During Therapy: WFL for tasks assessed/performed Overall Cognitive Status: Within Functional Limits for tasks assessed                                        Exercises Total Joint Exercises Ankle Circles/Pumps: AROM;Both;20 reps;Supine Quad  Sets: Strengthening;Both;10 reps;Supine (2 sets) Gluteal Sets: Strengthening;Both;10 reps;Supine (2 sets) Short Arc Quad: AAROM;AROM;Both;20 reps;Supine (Initial AAROM R, progressed to AROM; AROM L) Heel Slides: AAROM;Right;20 reps;Supine (AROM L) Hip ABduction/ADduction: AAROM;20 reps;Supine;Both Straight Leg Raises: AAROM;Right;10 reps;Supine (Strengthening L)    General Comments        Pertinent Vitals/Pain Pain Assessment: 0-10 Pain Score: 8  Pain Location: R hip to the knee Pain Descriptors / Indicators: Constant;Aching;Sharp;Shooting    Home Living                      Prior Function            PT Goals (current goals can now be found in the care plan section) Progress towards PT goals: Not progressing toward goals - comment    Frequency    BID      PT Plan Current plan remains appropriate    Co-evaluation              AM-PAC PT "6 Clicks" Daily Activity  Outcome  Measure  Difficulty turning over in bed (including adjusting bedclothes, sheets and blankets)?: Total Difficulty moving from lying on back to sitting on the side of the bed? : Total Difficulty sitting down on and standing up from a chair with arms (e.g., wheelchair, bedside commode, etc,.)?: Total Help needed moving to and from a bed to chair (including a wheelchair)?: Total Help needed walking in hospital room?: Total Help needed climbing 3-5 steps with a railing? : Total 6 Click Score: 6    End of Session Equipment Utilized During Treatment: Oxygen Activity Tolerance: Patient tolerated treatment well Patient left: in bed;with call bell/phone within reach;with bed alarm set;with family/visitor present;with SCD's reapplied   PT Visit Diagnosis: Other abnormalities of gait and mobility (R26.89);Muscle weakness (generalized) (M62.81)     Time: 1031-2811 PT Time Calculation (min) (ACUTE ONLY): 31 min  Charges:  $Therapeutic Exercise: 23-37 mins                    G CodesLarae Grooms, PTA 12/31/2016, 4:33 PM

## 2017-01-01 DIAGNOSIS — G8918 Other acute postprocedural pain: Secondary | ICD-10-CM | POA: Diagnosis not present

## 2017-01-01 DIAGNOSIS — I10 Essential (primary) hypertension: Secondary | ICD-10-CM | POA: Diagnosis not present

## 2017-01-01 DIAGNOSIS — J449 Chronic obstructive pulmonary disease, unspecified: Secondary | ICD-10-CM | POA: Diagnosis not present

## 2017-01-01 DIAGNOSIS — R2689 Other abnormalities of gait and mobility: Secondary | ICD-10-CM | POA: Diagnosis not present

## 2017-01-01 DIAGNOSIS — F3289 Other specified depressive episodes: Secondary | ICD-10-CM | POA: Diagnosis not present

## 2017-01-01 DIAGNOSIS — Z7902 Long term (current) use of antithrombotics/antiplatelets: Secondary | ICD-10-CM | POA: Diagnosis not present

## 2017-01-01 DIAGNOSIS — M6281 Muscle weakness (generalized): Secondary | ICD-10-CM | POA: Diagnosis not present

## 2017-01-01 DIAGNOSIS — M5431 Sciatica, right side: Secondary | ICD-10-CM | POA: Diagnosis not present

## 2017-01-01 DIAGNOSIS — Z96641 Presence of right artificial hip joint: Secondary | ICD-10-CM | POA: Diagnosis not present

## 2017-01-01 DIAGNOSIS — R262 Difficulty in walking, not elsewhere classified: Secondary | ICD-10-CM | POA: Diagnosis not present

## 2017-01-01 DIAGNOSIS — R6889 Other general symptoms and signs: Secondary | ICD-10-CM | POA: Diagnosis not present

## 2017-01-01 DIAGNOSIS — R2681 Unsteadiness on feet: Secondary | ICD-10-CM | POA: Diagnosis not present

## 2017-01-01 DIAGNOSIS — Z7401 Bed confinement status: Secondary | ICD-10-CM | POA: Diagnosis not present

## 2017-01-01 DIAGNOSIS — E785 Hyperlipidemia, unspecified: Secondary | ICD-10-CM | POA: Diagnosis not present

## 2017-01-01 DIAGNOSIS — Z79891 Long term (current) use of opiate analgesic: Secondary | ICD-10-CM | POA: Diagnosis not present

## 2017-01-01 NOTE — Progress Notes (Signed)
PT Cancellation Note  Patient Details Name: [facility] MRN: [id number] DOB: [birth year]   Cancelled Treatment:    Reason Eval/Treat Not Completed: Other (comment); Transport arrived at beginning of session just prior to any treatment being provided to take pt to SNF.  Will attempt to see pt at a future date/time if for any reason pt ends up not discharging this date.     Linus Salmons PT, DPT 01/01/17, 11:25 AM

## 2017-01-01 NOTE — Progress Notes (Addendum)
  Subjective: 3 Days Post-Op Procedure(s) (LRB): TOTAL HIP ARTHROPLASTY ANTERIOR APPROACH (Right) Patient reports pain as moderate. Improving slightly.  Patient seen in rounds with Dr. Rudene Christians. Patient is well, and has had no acute complaints or problems.  Plan is to go Rehab after hospital stay. Plan for today. Negative for chest pain and shortness of breath Fever: no Gastrointestinal: Negative for nausea and vomiting  Objective: Vital signs in last 24 hours: Temp:  [98.6 F (37 C)-99 F (37.2 C)] 98.6 F (37 C) (07/19 2355) Pulse Rate:  [66-78] 78 (07/19 2355) Resp:  [18] 18 (07/19 2355) BP: (119-128)/(44-46) 128/44 (07/19 2355) SpO2:  [92 %-100 %] 92 % (07/19 2355)  Intake/Output from previous day:  Intake/Output Summary (Last 24 hours) at 01/01/17 0633 Last data filed at 01/01/17 0500  Gross per 24 hour  Intake              360 ml  Output                0 ml  Net              360 ml    Intake/Output this shift: Total I/O In: 360 [P.O.:360] Out: -   Labs:  Recent Labs  12/29/16 1843 12/30/16 0416  HGB 13.9 12.5*    Recent Labs  12/29/16 1843 12/30/16 0416  WBC 11.8* 8.8  RBC 4.37* 3.90*  HCT 40.7 35.6*  PLT 133* 136*    Recent Labs  12/29/16 1843 12/30/16 0416  NA  --  137  K  --  4.6  CL  --  102  CO2  --  29  BUN  --  20  CREATININE 1.13 1.15  GLUCOSE  --  142*  CALCIUM  --  8.6*   No results for input(s): LABPT, INR in the last 72 hours.   EXAM General - Patient is Alert and Confused Extremity - Intact pulses distally Dorsiflexion/Plantar flexion intact Compartment soft Dressing/Incision - clean, dry, wound VAC in place Motor Function - intact, moving foot and toes well on exam. Ambulated several feet.  Past Medical History:  Diagnosis Date  . Cancer ([place])    skin cancer on the nose  . Chronic kidney disease    RENAL INSUFF  . Complication of anesthesia    could not get bowels to move after back surgery hospitalized 9 days  .  GERD (gastroesophageal reflux disease)   . HLD (hyperlipidemia)   . Hypertension   . Macular degeneration   . Pneumonia   . Sleep apnea     Assessment/Plan: 3 Days Post-Op Procedure(s) (LRB): TOTAL HIP ARTHROPLASTY ANTERIOR APPROACH (Right) Active Problems:   Primary localized osteoarthritis of right hip  Estimated body mass index is 32.28 kg/m as calculated from the following:   Height as of this encounter: 5\' 10"  (1.778 m).   Weight as of this encounter: 102.1 kg (225 lb). Advance diet Up with therapy D/C IV fluids  Discharge planning to rehabilitation.Plan for Today. Bowel management.  Suppository given this morning.  DVT Prophylaxis - Lovenox, Foot Pumps and TED hose Weight-Bearing as tolerated to right leg  Reche Dixon, PA-C Orthopaedic Surgery 01/01/2017, 6:33 AM

## 2017-01-01 NOTE — Progress Notes (Addendum)
Patient is medically stable for D/C to [facility] today. Per [facility] liaison patient can come today to room 601. RN will call report to 600 hall nurse at [phone] and arrange EMS for transport. [facility] authorization has been received, auth # [id number]. Clinical Education officer, museum (CSW) sent D/C orders to [facility] via [employer]. Patient is aware of above. CSW contacted patient's daughter [facility] and made her aware of above. Please reconsult if future social work needs arise. CSW signing off.   [employer], LCSW [phone]

## 2017-01-01 NOTE — Care Management Important Message (Signed)
Important Message  Patient Details  Name: [facility] MRN: [id number] Date of Birth: 1926/12/29   Medicare Important Message Given:  Yes    Jolly Mango, RN 01/01/2017, 8:37 AM

## 2017-01-01 NOTE — Progress Notes (Signed)
Patient alert and oriented to person. Complaining of moderate pain. Prevena wound vac in place. Patient on 2LO2. Lungs sound diminished, heart sounds normal. RN gave report to RN at [employer]. All questions answered. AVS in packet. EMS called for pick up.   Deri Fuelling, RN

## 2017-01-01 NOTE — Clinical Social Work Placement (Signed)
   CLINICAL SOCIAL WORK PLACEMENT  NOTE  Date:  01/01/2017  Patient Details  Name: [facility] MRN: [id number] Date of Birth: [birth year]  Clinical Social Work is seeking post-discharge placement for this patient at the [place] level of care (*CSW will initial, date and re-position this form in  chart as items are completed):  Yes   Patient/family provided with [facility] Work Department's list of facilities offering this level of care within the geographic area requested by the patient (or if unable, by the patient's family).  Yes   Patient/family informed of their freedom to choose among providers that offer the needed level of care, that participate in Medicare, Medicaid or managed care program needed by the patient, have an available bed and are willing to accept the patient.  Yes   Patient/family informed of [facility]'s ownership interest in [facility] and [facility], as well as of the fact that they are under no obligation to receive care at these facilities.  PASRR submitted to EDS on 12/30/16     PASRR number received on 12/30/16     Existing PASRR number confirmed on       FL2 transmitted to all facilities in geographic area requested by pt/family on 12/30/16     FL2 transmitted to all facilities within larger geographic area on       Patient informed that his/her managed care company has contracts with or will negotiate with certain facilities, including the following:        Yes   Patient/family informed of bed offers received.  Patient chooses bed at  ([facility] )     Physician recommends and patient chooses bed at      Patient to be transferred to  ([facility] ) on 01/01/17.  Patient to be transferred to facility by  [facility] )     Patient family notified on 01/01/17 of transfer.  Name of family member notified:  Patient's daughter [name] is aware of D/C today.      PHYSICIAN       Additional Comment:     _______________________________________________ , Veronia Beets, LCSW 01/01/2017, 11:01 AM

## 2017-01-01 NOTE — Discharge Summary (Signed)
Physician Discharge Summary  Subjective: 3 Days Post-Op Procedure(s) (LRB): TOTAL HIP ARTHROPLASTY ANTERIOR APPROACH (Right) Patient reports pain as moderate.   Patient seen in rounds with Dr. Rudene Christians. Patient is well, and has had no acute complaints or problems Patient is ready to go To skilled nursing and rehabilitation today.  Physician Discharge Summary  Patient ID: Jesse Meadows MRN: [id number] DOB/AGE: [birth year] 83 y.o.  Admit date: 12/29/2016 Discharge date: 01/01/2017  Admission Diagnoses:  Discharge Diagnoses:  Active Problems:   Primary localized osteoarthritis of right hip   Discharged Condition: fair  Hospital Course: The patient is postop day 3 from a right anterior approach total hip replacement. The patient is quite confused his first day but seemed to respond appropriately. The patient has been working with physical therapy and has only ambulated several feet. He is still having confusion although he is improving. His labs continue to remain normal. The patient is working on having a bowel movement before discharge.  Treatments: surgery:  TOTAL HIP ARTHROPLASTY ANTERIOR APPROACH (Right)  SURGEON: Laurene Footman, MD  ASSISTANTS: None  ANESTHESIA:   spinal  EBL:  Total I/O In: -  Out: 400 [Blood:400]  BLOOD ADMINISTERED:none  DRAINS: none   LOCAL MEDICATIONS USED:  MARCAINE     SPECIMEN:  Source of Specimen:  Right femoral head  DISPOSITION OF SPECIMEN:  PATHOLOGY  COUNTS:  YES  TOURNIQUET:  * No tourniquets in log *  IMPLANTS: Medacta AMIS for standard collared stem with 19 Mpact DM cup with liner and M 28 mm metal head  Discharge Exam: Blood pressure (!) 128/44, pulse 78, temperature 98.6 F (37 C), temperature source Oral, resp. rate 18, height 5\' 10"  (1.778 m), weight 102.1 kg (225 lb), SpO2 92 %.   Disposition: 01-Home or Self Care   Allergies as of 01/01/2017      Reactions   Beta Adrenergic Blockers Other (See Comments)    Bradycardia.   Penicillins Rash, Other (See Comments)   Has patient had a PCN reaction causing immediate rash, facial/tongue/throat swelling, SOB or lightheadedness with hypotension: No Has patient had a PCN reaction causing severe rash involving mucus membranes or skin necrosis: No Has patient had a PCN reaction that required hospitalization: No Has patient had a PCN reaction occurring within the last 10 years: No If all of the above answers are "NO", then may proceed with Cephalosporin use.      Medication List    TAKE these medications   acetaminophen 650 MG CR tablet Commonly known as:  TYLENOL Take 1,300 mg by mouth at bedtime.   amLODipine 5 MG tablet Commonly known as:  NORVASC Take 1 tablet (5 mg total) by mouth daily.   budesonide-formoterol 80-4.5 MCG/ACT inhaler Commonly known as:  SYMBICORT Inhale 2 puffs into the lungs 2 (two) times daily. What changed:  when to take this  reasons to take this   cyanocobalamin 1000 MCG/ML injection Commonly known as:  (VITAMIN B-12) Inject 200 mcg into the muscle every 30 (thirty) days.   vitamin B-12 1000 MCG tablet Commonly known as:  CYANOCOBALAMIN Take 1,000 mcg by mouth daily.   docusate sodium 100 MG capsule Commonly known as:  COLACE Take 1 capsule (100 mg total) by mouth 2 (two) times daily. What changed:  when to take this   enoxaparin 40 MG/0.4ML injection Commonly known as:  LOVENOX Inject 0.4 mLs (40 mg total) into the skin daily.   furosemide 20 MG tablet Commonly known as:  LASIX Take  20 mg by mouth daily as needed for fluid or edema.   gabapentin 600 MG tablet Commonly known as:  NEURONTIN Take 600 mg by mouth at bedtime.   latanoprost 0.005 % ophthalmic solution Commonly known as:  XALATAN Place 1 drop into both eyes at bedtime.   losartan 100 MG tablet Commonly known as:  COZAAR Take 100 mg by mouth daily.   oxyCODONE 5 MG immediate release tablet Commonly known as:  Oxy  IR/ROXICODONE Take 1 tablet (5 mg total) by mouth every 4 (four) hours as needed for breakthrough pain.   PRESERVISION AREDS PO Take 1 tablet by mouth daily.   simvastatin 40 MG tablet Commonly known as:  ZOCOR Take 40 mg by mouth daily at 6 PM.   traZODone 100 MG tablet Commonly known as:  DESYREL Take 100 mg by mouth at bedtime.       Contact information for follow-up providers    Hessie Knows, MD Follow up in 2 week(s).   Specialty:  Orthopedic Surgery Why:  For staple removal Contact information: [place] [address] [phone]            Contact information for after-discharge care    Destination    [facility] Follow up.   Specialty:  [place] information: [address] [place] [phone]                  Signed: Prescott Parma, [place] 01/01/2017, 6:35 AM   Objective: Vital signs in last 24 hours: Temp:  [98.6 F (37 C)-99 F (37.2 C)] 98.6 F (37 C) (07/19 2355) Pulse Rate:  [66-78] 78 (07/19 2355) Resp:  [18] 18 (07/19 2355) BP: (119-128)/(44-46) 128/44 (07/19 2355) SpO2:  [92 %-100 %] 92 % (07/19 2355)  Intake/Output from previous day:  Intake/Output Summary (Last 24 hours) at 01/01/17 0635 Last data filed at 01/01/17 0500  Gross per 24 hour  Intake              360 ml  Output                0 ml  Net              360 ml    Intake/Output this shift: Total I/O In: 360 [P.O.:360] Out: -   Labs:  Recent Labs  12/29/16 1843 12/30/16 0416  HGB 13.9 12.5*    Recent Labs  12/29/16 1843 12/30/16 0416  WBC 11.8* 8.8  RBC 4.37* 3.90*  HCT 40.7 35.6*  PLT 133* 136*    Recent Labs  12/29/16 1843 12/30/16 0416  NA  --  137  K  --  4.6  CL  --  102  CO2  --  29  BUN  --  20  CREATININE 1.13 1.15  GLUCOSE  --  142*  CALCIUM  --  8.6*   No results for input(s): LABPT, INR in the last 72 hours.  EXAM: General - Patient is Alert and  Confused Extremity - Sensation intact distally Dorsiflexion/Plantar flexion intact No cellulitis present Compartment soft Incision - clean, dry, wound VAC in place Motor Function -  plantarflexion and dorsiflexion intact.  Assessment/Plan: 3 Days Post-Op Procedure(s) (LRB): TOTAL HIP ARTHROPLASTY ANTERIOR APPROACH (Right) Procedure(s) (LRB): TOTAL HIP ARTHROPLASTY ANTERIOR APPROACH (Right) Past Medical History:  Diagnosis Date  . Cancer ([place])    skin cancer on the nose  . Chronic kidney disease    RENAL INSUFF  .  Complication of anesthesia    could not get bowels to move after back surgery hospitalized 9 days  . GERD (gastroesophageal reflux disease)   . HLD (hyperlipidemia)   . Hypertension   . Macular degeneration   . Pneumonia   . Sleep apnea    Active Problems:   Primary localized osteoarthritis of right hip  Estimated body mass index is 32.28 kg/m as calculated from the following:   Height as of this encounter: 5\' 10"  (1.778 m).   Weight as of this encounter: 102.1 kg (225 lb). Discharge to SNF Diet - Regular diet Follow up - in 2 weeks Activity - WBAT Disposition - Rehab Condition Upon Discharge - Stable DVT Prophylaxis - Lovenox and TED hose  Reche Dixon, PA-C Orthopaedic Surgery 01/01/2017, 6:35 AM

## 2017-01-01 NOTE — Progress Notes (Signed)
Suppository administered

## 2017-01-01 NOTE — Progress Notes (Signed)
EMS here for pick up.    , RN  

## 2017-01-05 DIAGNOSIS — Z7902 Long term (current) use of antithrombotics/antiplatelets: Secondary | ICD-10-CM | POA: Diagnosis not present

## 2017-01-05 DIAGNOSIS — Z96641 Presence of right artificial hip joint: Secondary | ICD-10-CM | POA: Diagnosis not present

## 2017-01-05 DIAGNOSIS — E785 Hyperlipidemia, unspecified: Secondary | ICD-10-CM | POA: Diagnosis not present

## 2017-01-05 DIAGNOSIS — R2689 Other abnormalities of gait and mobility: Secondary | ICD-10-CM | POA: Diagnosis not present

## 2017-01-05 DIAGNOSIS — I10 Essential (primary) hypertension: Secondary | ICD-10-CM | POA: Diagnosis not present

## 2017-01-05 DIAGNOSIS — G8918 Other acute postprocedural pain: Secondary | ICD-10-CM | POA: Diagnosis not present

## 2017-01-07 ENCOUNTER — Other Ambulatory Visit: Payer: Self-pay | Admitting: *Deleted

## 2017-01-07 NOTE — Patient Outreach (Signed)
[facility]) Care Management  01/07/2017  Jesse Meadows 30-Aug-1926 [id number]   Met with Jesse Meadows, SW at facility. She reports patient lives in duplex next to daughter who is very supportive.  She states his plan is to return home.   Met with patient at facility. Patient reports he is not progressing as fast as he hoped. He is trying to make a decision around going home. He wants to go home but unsure of what support he will need. Patient reports he was living alone with minimum support from daughter. He has a Secretary/administrator and daughter assists with transportation, medication management and meals.   Patient states he was sleeping in his lift chair due to back and hip issues and was only using a cane. He is now having to use a walker and is in more pain and taking pain medications around the clock.   Patient considering getting help in the home.   RNCM encouraged patient to have discussions with therapy about progress and what assistance they foresee and to discuss needs with daughter to see what she may be able to provide.  Patient agrees to plan RNCM discussed [facility] care management services and that patient may get a phone call upon discarge. Patient accepted a brochure for daughter to review, as patient has macular degeneration and cannot see very to read.   Plan to follow up as indicated at next facility visit.   Jesse Meadows. Jesse Purser, RN, BSN, [place] [phone]) Business Cell  [phone]) Toll Free Office

## 2017-01-12 DIAGNOSIS — Z96641 Presence of right artificial hip joint: Secondary | ICD-10-CM | POA: Diagnosis not present

## 2017-01-12 DIAGNOSIS — R2689 Other abnormalities of gait and mobility: Secondary | ICD-10-CM | POA: Diagnosis not present

## 2017-01-12 DIAGNOSIS — G8918 Other acute postprocedural pain: Secondary | ICD-10-CM | POA: Diagnosis not present

## 2017-01-12 DIAGNOSIS — J449 Chronic obstructive pulmonary disease, unspecified: Secondary | ICD-10-CM | POA: Diagnosis not present

## 2017-01-12 DIAGNOSIS — E785 Hyperlipidemia, unspecified: Secondary | ICD-10-CM | POA: Diagnosis not present

## 2017-01-12 DIAGNOSIS — Z79891 Long term (current) use of opiate analgesic: Secondary | ICD-10-CM | POA: Diagnosis not present

## 2017-01-12 DIAGNOSIS — M5431 Sciatica, right side: Secondary | ICD-10-CM | POA: Diagnosis not present

## 2017-01-15 DIAGNOSIS — Z96641 Presence of right artificial hip joint: Secondary | ICD-10-CM | POA: Insufficient documentation

## 2017-01-20 ENCOUNTER — Other Ambulatory Visit: Payer: Self-pay | Admitting: *Deleted

## 2017-01-20 NOTE — Patient Outreach (Signed)
[facility] [facility]) Care Management  01/20/2017  ALOYS HUPFER [birth year] [phone]   Met with patient at facility to follow up on last facility visit.  Patient reports he feels he will go home soon. He gave [facility] brochure to daughter. He feels he may call later on but does not want to sign up at this time.  RNCM reviewed he may get a follow up call and he said that would be fine.   Plan to sign off. Patient has [facility] program information. Royetta Crochet. Laymond Purser, RN, BSN, [place] [phone]) Business Cell  [phone]) Toll Free Office

## 2017-01-21 DIAGNOSIS — Z96641 Presence of right artificial hip joint: Secondary | ICD-10-CM | POA: Diagnosis not present

## 2017-01-21 DIAGNOSIS — J449 Chronic obstructive pulmonary disease, unspecified: Secondary | ICD-10-CM | POA: Diagnosis not present

## 2017-01-21 DIAGNOSIS — Z79891 Long term (current) use of opiate analgesic: Secondary | ICD-10-CM | POA: Diagnosis not present

## 2017-01-21 DIAGNOSIS — E785 Hyperlipidemia, unspecified: Secondary | ICD-10-CM | POA: Diagnosis not present

## 2017-01-21 DIAGNOSIS — R2689 Other abnormalities of gait and mobility: Secondary | ICD-10-CM | POA: Diagnosis not present

## 2017-01-21 DIAGNOSIS — I10 Essential (primary) hypertension: Secondary | ICD-10-CM | POA: Diagnosis not present

## 2017-01-21 DIAGNOSIS — G8918 Other acute postprocedural pain: Secondary | ICD-10-CM | POA: Diagnosis not present

## 2017-02-03 ENCOUNTER — Other Ambulatory Visit: Payer: Self-pay | Admitting: Internal Medicine

## 2017-02-03 ENCOUNTER — Ambulatory Visit
Admission: RE | Admit: 2017-02-03 | Discharge: 2017-02-03 | Disposition: A | Discharge status: Home / Self Care | Payer: PPO | Source: Ambulatory Visit | Attending: Internal Medicine | Admitting: Internal Medicine

## 2017-02-03 DIAGNOSIS — I10 Essential (primary) hypertension: Secondary | ICD-10-CM | POA: Diagnosis not present

## 2017-02-03 DIAGNOSIS — Z96641 Presence of right artificial hip joint: Secondary | ICD-10-CM | POA: Insufficient documentation

## 2017-02-03 DIAGNOSIS — G4733 Obstructive sleep apnea (adult) (pediatric): Secondary | ICD-10-CM | POA: Diagnosis not present

## 2017-02-03 DIAGNOSIS — R600 Localized edema: Secondary | ICD-10-CM

## 2017-02-03 DIAGNOSIS — M150 Primary generalized (osteo)arthritis: Secondary | ICD-10-CM | POA: Diagnosis not present

## 2017-02-03 DIAGNOSIS — R3915 Urgency of urination: Secondary | ICD-10-CM | POA: Diagnosis not present

## 2017-02-12 DIAGNOSIS — Z96641 Presence of right artificial hip joint: Secondary | ICD-10-CM | POA: Diagnosis not present

## 2017-04-08 DIAGNOSIS — M150 Primary generalized (osteo)arthritis: Secondary | ICD-10-CM | POA: Diagnosis not present

## 2017-04-08 DIAGNOSIS — I10 Essential (primary) hypertension: Secondary | ICD-10-CM | POA: Diagnosis not present

## 2017-04-08 DIAGNOSIS — E7800 Pure hypercholesterolemia, unspecified: Secondary | ICD-10-CM | POA: Diagnosis not present

## 2017-04-08 DIAGNOSIS — R05 Cough: Secondary | ICD-10-CM | POA: Diagnosis not present

## 2017-04-15 DIAGNOSIS — M2550 Pain in unspecified joint: Secondary | ICD-10-CM | POA: Diagnosis not present

## 2017-04-15 DIAGNOSIS — Z23 Encounter for immunization: Secondary | ICD-10-CM | POA: Diagnosis not present

## 2017-04-15 DIAGNOSIS — E7800 Pure hypercholesterolemia, unspecified: Secondary | ICD-10-CM | POA: Diagnosis not present

## 2017-04-15 DIAGNOSIS — I10 Essential (primary) hypertension: Secondary | ICD-10-CM | POA: Diagnosis not present

## 2017-04-15 DIAGNOSIS — Z0000 Encounter for general adult medical examination without abnormal findings: Secondary | ICD-10-CM | POA: Diagnosis not present

## 2017-04-15 DIAGNOSIS — R600 Localized edema: Secondary | ICD-10-CM | POA: Diagnosis not present

## 2017-04-16 DIAGNOSIS — Z23 Encounter for immunization: Secondary | ICD-10-CM | POA: Diagnosis not present

## 2017-08-09 DIAGNOSIS — Z23 Encounter for immunization: Secondary | ICD-10-CM | POA: Diagnosis not present

## 2017-08-09 DIAGNOSIS — E7800 Pure hypercholesterolemia, unspecified: Secondary | ICD-10-CM | POA: Diagnosis not present

## 2017-08-09 DIAGNOSIS — M2550 Pain in unspecified joint: Secondary | ICD-10-CM | POA: Diagnosis not present

## 2017-08-09 DIAGNOSIS — Z0000 Encounter for general adult medical examination without abnormal findings: Secondary | ICD-10-CM | POA: Diagnosis not present

## 2017-08-09 DIAGNOSIS — I10 Essential (primary) hypertension: Secondary | ICD-10-CM | POA: Diagnosis not present

## 2017-08-09 DIAGNOSIS — R600 Localized edema: Secondary | ICD-10-CM | POA: Diagnosis not present

## 2017-08-16 DIAGNOSIS — M150 Primary generalized (osteo)arthritis: Secondary | ICD-10-CM | POA: Diagnosis not present

## 2017-08-16 DIAGNOSIS — Z0000 Encounter for general adult medical examination without abnormal findings: Secondary | ICD-10-CM | POA: Diagnosis not present

## 2017-08-16 DIAGNOSIS — Z96641 Presence of right artificial hip joint: Secondary | ICD-10-CM | POA: Diagnosis not present

## 2017-08-16 DIAGNOSIS — H547 Unspecified visual loss: Secondary | ICD-10-CM | POA: Diagnosis not present

## 2017-08-16 DIAGNOSIS — E7800 Pure hypercholesterolemia, unspecified: Secondary | ICD-10-CM | POA: Diagnosis not present

## 2017-08-16 DIAGNOSIS — I10 Essential (primary) hypertension: Secondary | ICD-10-CM | POA: Diagnosis not present

## 2017-09-14 ENCOUNTER — Observation Stay: Payer: PPO | Admitting: Anesthesiology

## 2017-09-14 ENCOUNTER — Encounter: Payer: Self-pay | Admitting: Emergency Medicine

## 2017-09-14 ENCOUNTER — Ambulatory Visit: Admit: 2017-09-14 | Payer: PPO | Admitting: Surgery

## 2017-09-14 ENCOUNTER — Other Ambulatory Visit: Payer: Self-pay

## 2017-09-14 ENCOUNTER — Emergency Department: Payer: PPO

## 2017-09-14 ENCOUNTER — Observation Stay
Admission: EM | Admit: 2017-09-14 | Discharge: 2017-09-15 | Disposition: A | Discharge status: Home / Self Care | Payer: PPO | Attending: Surgery | Admitting: Surgery

## 2017-09-14 ENCOUNTER — Encounter
Admission: EM | Disposition: A | Discharge status: Home / Self Care | Payer: Self-pay | Source: Home / Self Care | Attending: Emergency Medicine

## 2017-09-14 DIAGNOSIS — I129 Hypertensive chronic kidney disease with stage 1 through stage 4 chronic kidney disease, or unspecified chronic kidney disease: Secondary | ICD-10-CM | POA: Diagnosis not present

## 2017-09-14 DIAGNOSIS — K8012 Calculus of gallbladder with acute and chronic cholecystitis without obstruction: Secondary | ICD-10-CM | POA: Diagnosis not present

## 2017-09-14 DIAGNOSIS — I10 Essential (primary) hypertension: Secondary | ICD-10-CM | POA: Diagnosis not present

## 2017-09-14 DIAGNOSIS — K219 Gastro-esophageal reflux disease without esophagitis: Secondary | ICD-10-CM | POA: Insufficient documentation

## 2017-09-14 DIAGNOSIS — R1010 Upper abdominal pain, unspecified: Secondary | ICD-10-CM

## 2017-09-14 DIAGNOSIS — E785 Hyperlipidemia, unspecified: Secondary | ICD-10-CM | POA: Diagnosis not present

## 2017-09-14 DIAGNOSIS — Z79899 Other long term (current) drug therapy: Secondary | ICD-10-CM | POA: Insufficient documentation

## 2017-09-14 DIAGNOSIS — Z87891 Personal history of nicotine dependence: Secondary | ICD-10-CM | POA: Insufficient documentation

## 2017-09-14 DIAGNOSIS — Z85828 Personal history of other malignant neoplasm of skin: Secondary | ICD-10-CM | POA: Insufficient documentation

## 2017-09-14 DIAGNOSIS — K810 Acute cholecystitis: Secondary | ICD-10-CM

## 2017-09-14 DIAGNOSIS — N189 Chronic kidney disease, unspecified: Secondary | ICD-10-CM | POA: Diagnosis not present

## 2017-09-14 DIAGNOSIS — Z880 Allergy status to penicillin: Secondary | ICD-10-CM | POA: Diagnosis not present

## 2017-09-14 DIAGNOSIS — R1033 Periumbilical pain: Secondary | ICD-10-CM | POA: Diagnosis not present

## 2017-09-14 DIAGNOSIS — G4730 Sleep apnea, unspecified: Secondary | ICD-10-CM | POA: Insufficient documentation

## 2017-09-14 DIAGNOSIS — K8020 Calculus of gallbladder without cholecystitis without obstruction: Secondary | ICD-10-CM | POA: Diagnosis not present

## 2017-09-14 HISTORY — PX: CHOLECYSTECTOMY: SHX55

## 2017-09-14 LAB — COMPREHENSIVE METABOLIC PANEL
ALBUMIN: 5 g/dL (ref 3.5–5.0)
ALT: 22 U/L (ref 17–63)
AST: 32 U/L (ref 15–41)
Alkaline Phosphatase: 69 U/L (ref 38–126)
Anion gap: 10 (ref 5–15)
BUN: 28 mg/dL — ABNORMAL HIGH (ref 6–20)
CHLORIDE: 103 mmol/L (ref 101–111)
CO2: 29 mmol/L (ref 22–32)
CREATININE: 1.31 mg/dL — AB (ref 0.61–1.24)
Calcium: 9.8 mg/dL (ref 8.9–10.3)
GFR calc Af Amer: 54 mL/min — ABNORMAL LOW (ref >60)
GFR calc non Af Amer: 47 mL/min — ABNORMAL LOW (ref >60)
GLUCOSE: 178 mg/dL — AB (ref 65–99)
POTASSIUM: 4.6 mmol/L (ref 3.5–5.1)
Sodium: 142 mmol/L (ref 135–145)
Total Bilirubin: 1.3 mg/dL — ABNORMAL HIGH (ref 0.3–1.2)
Total Protein: 8.1 g/dL (ref 6.5–8.1)

## 2017-09-14 LAB — URINALYSIS, COMPLETE (UACMP) WITH MICROSCOPIC
BACTERIA UA: NONE SEEN
Bilirubin Urine: NEGATIVE
GLUCOSE, UA: NEGATIVE mg/dL
Hgb urine dipstick: NEGATIVE
KETONES UR: NEGATIVE mg/dL
Leukocytes, UA: NEGATIVE
Nitrite: NEGATIVE
Protein, ur: NEGATIVE mg/dL
SQUAMOUS EPITHELIAL / LPF: NONE SEEN
Specific Gravity, Urine: >1.046 — ABNORMAL HIGH (ref 1.005–1.030)
pH: 6 (ref 5.0–8.0)

## 2017-09-14 LAB — CBC
HEMATOCRIT: 48.6 % (ref 40.0–52.0)
HEMOGLOBIN: 16.2 g/dL (ref 13.0–18.0)
MCH: 31 pg (ref 26.0–34.0)
MCHC: 33.2 g/dL (ref 32.0–36.0)
MCV: 93.2 fL (ref 80.0–100.0)
Platelets: 164 10*3/uL (ref 150–440)
RBC: 5.22 MIL/uL (ref 4.40–5.90)
RDW: 14.1 % (ref 11.5–14.5)
WBC: 12.9 10*3/uL — AB (ref 3.8–10.6)

## 2017-09-14 LAB — LIPASE, BLOOD: LIPASE: 49 U/L (ref 11–51)

## 2017-09-14 SURGERY — LAPAROSCOPIC CHOLECYSTECTOMY
Anesthesia: General | Wound class: Clean Contaminated

## 2017-09-14 MED ORDER — ONDANSETRON 4 MG PO TBDP: 4.0000 mg | ORAL_TABLET | Freq: Four times a day (QID) | ORAL | Status: DC | PRN

## 2017-09-14 MED ORDER — LIDOCAINE HCL (CARDIAC) 20 MG/ML IV SOLN
INTRAVENOUS | Status: DC | PRN
  Administered 2017-09-14: 40 mg via INTRAVENOUS

## 2017-09-14 MED ORDER — SUGAMMADEX SODIUM 500 MG/5ML IV SOLN
INTRAVENOUS | Status: AC
Start: 2017-09-14 — End: 2017-09-14
  Filled 2017-09-14: qty 5

## 2017-09-14 MED ORDER — METRONIDAZOLE IN NACL 5-0.79 MG/ML-% IV SOLN
500.0000 mg | Freq: Three times a day (TID) | INTRAVENOUS | Status: DC
  Administered 2017-09-14 – 2017-09-15 (×3): 500 mg via INTRAVENOUS
  Filled 2017-09-14 (×5): qty 100

## 2017-09-14 MED ORDER — SODIUM CHLORIDE FLUSH 0.9 % IV SOLN
INTRAVENOUS | Status: AC
  Filled 2017-09-14: qty 10

## 2017-09-14 MED ORDER — LATANOPROST 0.005 % OP SOLN
1.0000 [drp] | Freq: Every day | OPHTHALMIC | Status: DC
  Administered 2017-09-14: 1 [drp] via OPHTHALMIC
  Filled 2017-09-14 (×2): qty 2.5

## 2017-09-14 MED ORDER — FENTANYL CITRATE (PF) 100 MCG/2ML IJ SOLN: 25.0000 ug | INTRAMUSCULAR | Status: DC | PRN

## 2017-09-14 MED ORDER — SUGAMMADEX SODIUM 200 MG/2ML IV SOLN: INTRAVENOUS | Status: DC | PRN

## 2017-09-14 MED ORDER — IOPAMIDOL (ISOVUE-300) INJECTION 61%
100.0000 mL | Freq: Once | INTRAVENOUS | Status: AC | PRN
  Administered 2017-09-14: 100 mL via INTRAVENOUS

## 2017-09-14 MED ORDER — LACTATED RINGERS IV SOLN
INTRAVENOUS | Status: DC | PRN
  Administered 2017-09-14: 14:00:00 via INTRAVENOUS

## 2017-09-14 MED ORDER — PROPOFOL 10 MG/ML IV BOLUS
INTRAVENOUS | Status: AC
  Filled 2017-09-14: qty 20

## 2017-09-14 MED ORDER — IOPAMIDOL (ISOVUE-300) INJECTION 61%
30.0000 mL | Freq: Once | INTRAVENOUS | Status: AC | PRN
  Administered 2017-09-14: 30 mL via ORAL

## 2017-09-14 MED ORDER — LIDOCAINE HCL (PF) 2 % IJ SOLN
INTRAMUSCULAR | Status: AC
  Filled 2017-09-14: qty 10

## 2017-09-14 MED ORDER — HYDROMORPHONE HCL 1 MG/ML IJ SOLN: 0.5000 mg | INTRAMUSCULAR | Status: DC | PRN

## 2017-09-14 MED ORDER — SUCCINYLCHOLINE CHLORIDE 20 MG/ML IJ SOLN
INTRAMUSCULAR | Status: DC | PRN
  Administered 2017-09-14: 100 mg via INTRAVENOUS

## 2017-09-14 MED ORDER — ROCURONIUM BROMIDE 50 MG/5ML IV SOLN
INTRAVENOUS | Status: AC
Start: 2017-09-14 — End: 2017-09-14
  Filled 2017-09-14: qty 1

## 2017-09-14 MED ORDER — SIMVASTATIN 20 MG PO TABS
20.0000 mg | ORAL_TABLET | Freq: Every evening | ORAL | Status: DC
  Administered 2017-09-14: 20 mg via ORAL
  Filled 2017-09-14 (×2): qty 1

## 2017-09-14 MED ORDER — AMLODIPINE BESYLATE 5 MG PO TABS
5.0000 mg | ORAL_TABLET | Freq: Every day | ORAL | Status: DC
  Administered 2017-09-15: 5 mg via ORAL
  Filled 2017-09-14: qty 1

## 2017-09-14 MED ORDER — PROPOFOL 10 MG/ML IV BOLUS
INTRAVENOUS | Status: DC | PRN
  Administered 2017-09-14: 130 mg via INTRAVENOUS

## 2017-09-14 MED ORDER — SUGAMMADEX SODIUM 200 MG/2ML IV SOLN
INTRAVENOUS | Status: DC | PRN
  Administered 2017-09-14: 217.8 mg via INTRAVENOUS

## 2017-09-14 MED ORDER — FENTANYL CITRATE (PF) 100 MCG/2ML IJ SOLN
INTRAMUSCULAR | Status: DC | PRN
  Administered 2017-09-14 (×2): 50 ug via INTRAVENOUS
  Administered 2017-09-14: 25 ug via INTRAVENOUS

## 2017-09-14 MED ORDER — ROCURONIUM BROMIDE 100 MG/10ML IV SOLN
INTRAVENOUS | Status: DC | PRN
  Administered 2017-09-14: 5 mg via INTRAVENOUS
  Administered 2017-09-14: 45 mg via INTRAVENOUS
  Administered 2017-09-14: 20 mg via INTRAVENOUS

## 2017-09-14 MED ORDER — MOMETASONE FURO-FORMOTEROL FUM 100-5 MCG/ACT IN AERO
2.0000 | INHALATION_SPRAY | Freq: Two times a day (BID) | RESPIRATORY_TRACT | Status: DC
  Administered 2017-09-14 – 2017-09-15 (×2): 2 via RESPIRATORY_TRACT
  Filled 2017-09-14: qty 8.8

## 2017-09-14 MED ORDER — GABAPENTIN 600 MG PO TABS
600.0000 mg | ORAL_TABLET | Freq: Every day | ORAL | Status: DC
  Administered 2017-09-14: 600 mg via ORAL
  Filled 2017-09-14: qty 1

## 2017-09-14 MED ORDER — POLYETHYLENE GLYCOL 3350 17 G PO PACK
17.0000 g | PACK | Freq: Every day | ORAL | Status: DC | PRN
  Administered 2017-09-15: 17 g via ORAL
  Filled 2017-09-14: qty 1

## 2017-09-14 MED ORDER — FUROSEMIDE 20 MG PO TABS: 20.0000 mg | ORAL_TABLET | Freq: Every day | ORAL | Status: DC

## 2017-09-14 MED ORDER — MORPHINE SULFATE (PF) 4 MG/ML IV SOLN
4.0000 mg | Freq: Once | INTRAVENOUS | Status: AC
  Administered 2017-09-14: 4 mg via INTRAVENOUS
  Filled 2017-09-14: qty 1

## 2017-09-14 MED ORDER — IBUPROFEN 400 MG PO TABS
400.0000 mg | ORAL_TABLET | Freq: Once | ORAL | Status: AC | PRN
  Administered 2017-09-14: 400 mg via ORAL
  Filled 2017-09-14: qty 1

## 2017-09-14 MED ORDER — ONDANSETRON HCL 4 MG/2ML IJ SOLN: 4.0000 mg | Freq: Four times a day (QID) | INTRAMUSCULAR | Status: DC | PRN

## 2017-09-14 MED ORDER — MIDAZOLAM HCL 2 MG/2ML IJ SOLN
INTRAMUSCULAR | Status: AC
  Filled 2017-09-14: qty 2

## 2017-09-14 MED ORDER — PANTOPRAZOLE SODIUM 40 MG IV SOLR
40.0000 mg | Freq: Every day | INTRAVENOUS | Status: DC
  Administered 2017-09-14: 40 mg via INTRAVENOUS
  Filled 2017-09-14: qty 40

## 2017-09-14 MED ORDER — OXYCODONE HCL 5 MG PO TABS
5.0000 mg | ORAL_TABLET | ORAL | Status: DC | PRN
  Administered 2017-09-14 – 2017-09-15 (×3): 10 mg via ORAL
  Filled 2017-09-14 (×3): qty 2

## 2017-09-14 MED ORDER — FENTANYL CITRATE (PF) 100 MCG/2ML IJ SOLN
INTRAMUSCULAR | Status: AC
  Filled 2017-09-14: qty 2

## 2017-09-14 MED ORDER — ONDANSETRON HCL 4 MG/2ML IJ SOLN
INTRAMUSCULAR | Status: DC | PRN
Start: 2017-09-14 — End: 2017-09-14
  Administered 2017-09-14: 4 mg via INTRAVENOUS

## 2017-09-14 MED ORDER — ONDANSETRON 4 MG PO TBDP
ORAL_TABLET | ORAL | Status: AC
  Filled 2017-09-14: qty 1

## 2017-09-14 MED ORDER — BUPIVACAINE-EPINEPHRINE (PF) 0.25% -1:200000 IJ SOLN
INTRAMUSCULAR | Status: DC | PRN
  Administered 2017-09-14: 30 mL via PERINEURAL

## 2017-09-14 MED ORDER — OCUVITE-LUTEIN PO CAPS
1.0000 | ORAL_CAPSULE | Freq: Every day | ORAL | Status: DC
  Administered 2017-09-15: 1 via ORAL
  Filled 2017-09-14: qty 1

## 2017-09-14 MED ORDER — ENOXAPARIN SODIUM 40 MG/0.4ML ~~LOC~~ SOLN
40.0000 mg | SUBCUTANEOUS | Status: DC
  Administered 2017-09-15: 40 mg via SUBCUTANEOUS
  Filled 2017-09-14: qty 0.4

## 2017-09-14 MED ORDER — PHENYLEPHRINE HCL 10 MG/ML IJ SOLN
INTRAMUSCULAR | Status: DC | PRN
  Administered 2017-09-14 (×2): 100 ug via INTRAVENOUS
  Administered 2017-09-14: 200 ug via INTRAVENOUS

## 2017-09-14 MED ORDER — ONDANSETRON 4 MG PO TBDP
4.0000 mg | ORAL_TABLET | Freq: Once | ORAL | Status: AC
  Administered 2017-09-14: 4 mg via ORAL

## 2017-09-14 MED ORDER — LOSARTAN POTASSIUM 50 MG PO TABS: 100.0000 mg | ORAL_TABLET | Freq: Every day | ORAL | Status: DC

## 2017-09-14 MED ORDER — PHENYLEPHRINE HCL 10 MG/ML IJ SOLN
INTRAMUSCULAR | Status: AC
  Filled 2017-09-14: qty 1

## 2017-09-14 MED ORDER — CIPROFLOXACIN IN D5W 400 MG/200ML IV SOLN
400.0000 mg | Freq: Two times a day (BID) | INTRAVENOUS | Status: DC
  Administered 2017-09-14 – 2017-09-15 (×2): 400 mg via INTRAVENOUS
  Filled 2017-09-14 (×3): qty 200

## 2017-09-14 MED ORDER — HYDROCODONE-ACETAMINOPHEN 7.5-325 MG PO TABS: 1.0000 | ORAL_TABLET | Freq: Once | ORAL | Status: DC | PRN

## 2017-09-14 MED ORDER — BUPIVACAINE-EPINEPHRINE (PF) 0.25% -1:200000 IJ SOLN
INTRAMUSCULAR | Status: AC
  Filled 2017-09-14: qty 30

## 2017-09-14 MED ORDER — PROMETHAZINE HCL 25 MG/ML IJ SOLN: 6.2500 mg | INTRAMUSCULAR | Status: DC | PRN

## 2017-09-14 MED ORDER — LACTATED RINGERS IV SOLN: 125.0000 mL/h | INTRAVENOUS | Status: DC

## 2017-09-14 MED ORDER — ACETAMINOPHEN 500 MG PO TABS
1000.0000 mg | ORAL_TABLET | Freq: Four times a day (QID) | ORAL | Status: DC
  Administered 2017-09-15 (×2): 1000 mg via ORAL
  Filled 2017-09-14 (×2): qty 2

## 2017-09-14 MED ORDER — MEPERIDINE HCL 50 MG/ML IJ SOLN: 6.2500 mg | INTRAMUSCULAR | Status: DC | PRN

## 2017-09-14 MED ORDER — SUCCINYLCHOLINE CHLORIDE 20 MG/ML IJ SOLN
INTRAMUSCULAR | Status: AC
  Filled 2017-09-14: qty 1

## 2017-09-14 MED ORDER — ONDANSETRON HCL 4 MG/2ML IJ SOLN
4.0000 mg | Freq: Once | INTRAMUSCULAR | Status: AC
Start: 2017-09-14 — End: 2017-09-14
  Administered 2017-09-14: 4 mg via INTRAVENOUS
  Filled 2017-09-14: qty 2

## 2017-09-14 SURGICAL SUPPLY — 49 items
ADH SKN CLS APL DERMABOND .7 (GAUZE/BANDAGES/DRESSINGS) ×1
APPLIER CLIP 5 13 M/L LIGAMAX5 (MISCELLANEOUS) ×3
APR CLP MED LRG 5 ANG JAW (MISCELLANEOUS) ×1
BAG SPEC RTRVL LRG 6X4 10 (ENDOMECHANICALS) ×1
BLADE SURG 15 STRL LF DISP TIS (BLADE) ×1 IMPLANT
BLADE SURG 15 STRL SS (BLADE) ×3
BULB RESERV EVAC DRAIN JP 100C (MISCELLANEOUS) IMPLANT
CANISTER SUCT 1200ML W/VALVE (MISCELLANEOUS) ×3 IMPLANT
CATH CHOLANGI 4FR 420404F (CATHETERS) IMPLANT
CHLORAPREP W/TINT 26ML (MISCELLANEOUS) ×3 IMPLANT
CLIP APPLIE 5 13 M/L LIGAMAX5 (MISCELLANEOUS) ×1 IMPLANT
CONRAY 60ML FOR OR (MISCELLANEOUS) ×3 IMPLANT
DERMABOND ADVANCED (GAUZE/BANDAGES/DRESSINGS) ×2
DERMABOND ADVANCED .7 DNX12 (GAUZE/BANDAGES/DRESSINGS) ×1 IMPLANT
DRAIN CHANNEL JP 19F (MISCELLANEOUS) IMPLANT
ELECT REM PT RETURN 9FT ADLT (ELECTROSURGICAL) ×3
ELECTRODE REM PT RTRN 9FT ADLT (ELECTROSURGICAL) ×1 IMPLANT
FILTER LAP SMOKE EVAC STRL (MISCELLANEOUS) ×3 IMPLANT
GLOVE SURG SYN 7.0 (GLOVE) ×3 IMPLANT
GLOVE SURG SYN 7.5  E (GLOVE) ×2
GLOVE SURG SYN 7.5 E (GLOVE) ×1 IMPLANT
GLOVE SURG SYN 7.5 PF PI (GLOVE) ×1 IMPLANT
GOWN STRL REUS W/ TWL LRG LVL3 (GOWN DISPOSABLE) ×3 IMPLANT
GOWN STRL REUS W/TWL LRG LVL3 (GOWN DISPOSABLE) ×9
IRRIGATION STRYKERFLOW (MISCELLANEOUS) IMPLANT
IRRIGATOR STRYKERFLOW (MISCELLANEOUS) ×3
IV CATH ANGIO 12GX3 LT BLUE (NEEDLE) ×3 IMPLANT
IV NS 1000ML (IV SOLUTION) ×3
IV NS 1000ML BAXH (IV SOLUTION) ×1 IMPLANT
JACKSON PRATT 10 (INSTRUMENTS) IMPLANT
L-HOOK LAP DISP 36CM (ELECTROSURGICAL) ×3
LABEL OR SOLS (LABEL) ×3 IMPLANT
LHOOK LAP DISP 36CM (ELECTROSURGICAL) ×1 IMPLANT
NEEDLE HYPO 22GX1.5 SAFETY (NEEDLE) ×6 IMPLANT
PACK LAP CHOLECYSTECTOMY (MISCELLANEOUS) ×3 IMPLANT
PENCIL ELECTRO HAND CTR (MISCELLANEOUS) ×3 IMPLANT
POUCH SPECIMEN RETRIEVAL 10MM (ENDOMECHANICALS) ×3 IMPLANT
SCISSORS METZENBAUM CVD 33 (INSTRUMENTS) ×3 IMPLANT
SLEEVE ADV FIXATION 5X100MM (TROCAR) ×9 IMPLANT
SPONGE VERSALON 4X4 4PLY (MISCELLANEOUS) IMPLANT
SUT ETHILON 3-0 FS-10 30 BLK (SUTURE)
SUT MNCRL 4-0 (SUTURE) ×3
SUT MNCRL 4-0 27XMFL (SUTURE) ×1
SUT VICRYL 0 AB UR-6 (SUTURE) ×3 IMPLANT
SUTURE EHLN 3-0 FS-10 30 BLK (SUTURE) IMPLANT
SUTURE MNCRL 4-0 27XMF (SUTURE) ×1 IMPLANT
TROCAR BALLN GELPORT 12X130M (ENDOMECHANICALS) ×3 IMPLANT
TROCAR Z-THREAD OPTICAL 5X100M (TROCAR) ×3 IMPLANT
TUBING INSUFFLATION (TUBING) ×3 IMPLANT

## 2017-09-14 NOTE — Anesthesia Post-op Follow-up Note (Signed)
Anesthesia QCDR form completed.        

## 2017-09-14 NOTE — Op Note (Signed)
  Procedure Date:  09/14/2017  Pre-operative Diagnosis:  Acute cholecystitis  Post-operative Diagnosis:  Acute cholecystitis  Procedure:  Laparoscopic cholecystectomy  Surgeon:  Melvyn Neth, MD  Anesthesia:  General endotracheal  Estimated Blood Loss:  25 ml  Specimens:  gallbladder  Complications:  None  Indications for Procedure:  This is a 84 y.o. male who presents with abdominal pain and workup revealing acute cholecystitis.  The benefits, complications, treatment options, and expected outcomes were discussed with the patient. The risks of bleeding, infection, recurrence of symptoms, failure to resolve symptoms, bile duct damage, bile duct leak, retained common bile duct stone, bowel injury, and need for further procedures were all discussed with the patient and he was willing to proceed.  Description of Procedure: The patient was correctly identified in the preoperative area and brought into the operating room.  The patient was placed supine with VTE prophylaxis in place.  Appropriate time-outs were performed.  Anesthesia was induced and the patient was intubated.  Appropriate antibiotics were infused.  The abdomen was prepped and draped in a sterile fashion. An infraumbilical incision was made. A cutdown technique was used to enter the abdominal cavity without injury, and a Hasson trocar was inserted.  Pneumoperitoneum was obtained with appropriate opening pressures.  A 5-mm port was placed in the subxiphoid area and two 5-mm ports were placed in the right upper quadrant under direct visualization.  The gallbladder was identified and found to be distended, requiring needle decompression.  The fundus was grasped and retracted cephalad.  Adhesions were lysed bluntly and with electrocautery. The infundibulum was grasped and retracted laterally, exposing the peritoneum overlying the gallbladder.  This was incised with electrocautery and extended on either side of the gallbladder.  The  cystic duct and both anterior and posterior branches of the cystic artery were clearly identified and bluntly dissected.  All were clipped twice proximally and once distally, cutting in between.  The gallbladder was taken from the gallbladder fossa in a retrograde fashion with electrocautery. In doing this, there was bile spillage. The gallbladder was placed in an Endocatch bag and brought out via the umbilical incision. The liver bed was inspected and any bleeding was controlled with electrocautery. The right upper quadrant was then inspected again revealing intact clips, no bleeding, and no ductal injury.  The area was thoroughly irrigated with 1 L of normal saline until clear fluid return.  The 5 mm ports were removed under direct visualization and the Hasson trocar was removed.  The fascial opening was closed using 0 vicryl suture.  Local anesthetic was infused in all incisions and the incisions were closed with 4-0 Monocryl.  The wounds were cleaned and sealed with DermaBond.  The patient was emerged from anesthesia and extubated and brought to the recovery room for further management.  The patient tolerated the procedure well and all counts were correct at the end of the case.   Melvyn Neth, MD

## 2017-09-14 NOTE — ED Notes (Signed)
Report to Earlie Server, RN in [place];

## 2017-09-14 NOTE — ED Notes (Signed)
Patient transported to CT 

## 2017-09-14 NOTE — Care Management Obs Status (Signed)
[place] NOTIFICATION   Patient Details  Name: CAEDON BOND MRN: [id number] Date of Birth: [birth year]   Medicare Observation Status Notification Given:  Yes    Marshell Garfinkel, RN 09/14/2017, 1:24 PM

## 2017-09-14 NOTE — Anesthesia Postprocedure Evaluation (Signed)
Anesthesia Post Note  Patient: Jesse Meadows  Procedure(s) Performed: LAPAROSCOPIC CHOLECYSTECTOMY (N/A )  Patient location during evaluation: PACU Anesthesia Type: General Level of consciousness: awake and alert and oriented Pain management: pain level controlled Vital Signs Assessment: post-procedure vital signs reviewed and stable Respiratory status: spontaneous breathing Cardiovascular status: blood pressure returned to baseline Anesthetic complications: no     Last Vitals:  Vitals:   09/14/17 1854 09/14/17 1935  BP: (!) 142/78 125/78  Pulse: 75 75  Resp:  18  Temp:    SpO2: 90% 96%    Last Pain:  Vitals:   09/14/17 1822  TempSrc:   PainSc: 5                  ,

## 2017-09-14 NOTE — ED Notes (Signed)
Patient transported to Ultrasound 

## 2017-09-14 NOTE — ED Notes (Signed)
Pt placed on 2L [place] while resting, oxygen 87% on RA while resting with eyes closed in bed. Easily arousable and oxygen placed on patient

## 2017-09-14 NOTE — ED Notes (Signed)
ED Provider at bedside. 

## 2017-09-14 NOTE — ED Provider Notes (Signed)
[facility] Emergency Department Provider Note   ____________________________________________    I have reviewed the triage vital signs and the nursing notes.   HISTORY  Chief Complaint Abdominal Pain     HPI Jesse Meadows is a 84 y.o. male who presents with complaints of abdominal pain.  Patient reports he developed periumbilical abdominal pain which developed after eating at seafood restaurant last night at approximately 7 PM.  The pain has continued to become worse.  He reports is moderate to severe currently and cramping in nature.  He reports no bowel movements and not passing gas.  History of hernia surgery in the past bilaterally.  No fevers or chills.  Positive nausea and vomiting.  Has not taken anything for this   Past Medical History:  Diagnosis Date  . Cancer ([place])    skin cancer on the nose  . Chronic kidney disease    RENAL INSUFF  . Complication of anesthesia    could not get bowels to move after back surgery hospitalized 9 days  . GERD (gastroesophageal reflux disease)   . HLD (hyperlipidemia)   . Hypertension   . Macular degeneration   . Pneumonia   . Sleep apnea     Patient Active Problem List   Diagnosis Date Noted  . Primary localized osteoarthritis of right hip 12/29/2016  . Pressure injury of skin 11/04/2016  . Sepsis ([place]) 11/03/2016  . HCAP (healthcare-associated pneumonia) 11/03/2016  . HLD (hyperlipidemia) 11/03/2016  . DEHYDRATION 08/27/2010  . LABRYNTHITIS 08/27/2010  . INSOMNIA UNSPECIFIED 08/27/2010  . Essential hypertension 08/14/2010  . ALLERGIC RHINITIS CAUSE UNSPECIFIED 08/14/2010  . SCIATICA, RIGHT 08/14/2010    Past Surgical History:  Procedure Laterality Date  . BACK SURGERY    . CATARACT EXTRACTION, BILATERAL    . INGUINAL HERNIA REPAIR Left 01/24/2016   Procedure: HERNIA REPAIR INGUINAL ADULT;  Surgeon: Leonie Green, MD;  Location: [facility] ORS;  Service: General;  Laterality: Left;  .  TOTAL HIP ARTHROPLASTY Right 12/29/2016   Procedure: TOTAL HIP ARTHROPLASTY ANTERIOR APPROACH;  Surgeon: Hessie Knows, MD;  Location: [facility] ORS;  Service: Orthopedics;  Laterality: Right;  . UVULOPALATOPHARYNGOPLASTY      Prior to Admission medications   Medication Sig Start Date End Date Taking? Authorizing Provider  acetaminophen (TYLENOL) 650 MG CR tablet Take 1,300 mg by mouth at bedtime.   Yes [provider]  amLODipine (NORVASC) 5 MG tablet Take 1 tablet (5 mg total) by mouth daily. 11/07/16  Yes Gladstone Lighter, MD  docusate sodium (COLACE) 100 MG capsule Take 1 capsule (100 mg total) by mouth 2 (two) times daily. Patient taking differently: Take 100 mg by mouth at bedtime.  11/06/16  Yes Gladstone Lighter, MD  furosemide (LASIX) 20 MG tablet Take 20 mg by mouth daily.    Yes [provider]  gabapentin (NEURONTIN) 600 MG tablet Take 600 mg by mouth at bedtime.   Yes [provider]  latanoprost (XALATAN) 0.005 % ophthalmic solution Place 1 drop into both eyes at bedtime.   Yes [provider]  losartan (COZAAR) 100 MG tablet Take 100 mg by mouth daily.   Yes [provider]  Multiple Vitamins-Minerals (PRESERVISION AREDS) CAPS Take 1 capsule by mouth daily.    Yes [provider]  simvastatin (ZOCOR) 20 MG tablet Take 20 mg by mouth every evening.    Yes [provider]  vitamin B-12 (CYANOCOBALAMIN) 1000 MCG tablet Take 1,000 mcg by mouth daily.  Yes [provider]  budesonide-formoterol (SYMBICORT) 80-4.5 MCG/ACT inhaler Inhale 2 puffs into the lungs 2 (two) times daily. Patient not taking: Reported on 09/14/2017 11/06/16   Gladstone Lighter, MD  enoxaparin (LOVENOX) 40 MG/0.4ML injection Inject 0.4 mLs (40 mg total) into the skin daily. Patient not taking: Reported on 09/14/2017 12/31/16   Reche Dixon, PA-C  oxyCODONE (OXY IR/ROXICODONE) 5 MG immediate release tablet Take 1 tablet (5 mg total) by mouth every 4  (four) hours as needed for breakthrough pain. Patient not taking: Reported on 09/14/2017 12/31/16   Reche Dixon, PA-C     Allergies Beta adrenergic blockers and Penicillins  Family History  Problem Relation Age of Onset  . Cancer Mother   . CAD Mother   . ALS Father     Social History Social History   Tobacco Use  . Smoking status: Former Research scientist (life sciences)  . Smokeless tobacco: Former Network engineer Use Topics  . Alcohol use: No  . Drug use: No    Review of Systems  Constitutional: No fever/chills Eyes: No visual changes.  ENT: No sore throat. Cardiovascular: Denies chest pain. Respiratory: Denies shortness of breath. Gastrointestinal: As above Genitourinary: Negative for dysuria. Musculoskeletal: Negative for back pain.  History of hip replacement skin: Negative for rash. Neurological: Negative for headaches    ____________________________________________   PHYSICAL EXAM:  VITAL SIGNS: ED Triage Vitals [09/14/17 0457]  Enc Vitals Group     BP (!) 143/68     Pulse Rate 60     Resp 18     Temp 98 F (36.7 C)     Temp Source Oral     SpO2 94 %     Weight 109.3 kg (241 lb)     Height 1.778 m (5\' 10" )     Head Circumference      Peak Flow      Pain Score 8     Pain Loc      Pain Edu?      Excl. in [place]?     Constitutional: Alert and oriented. No acute distress. Pleasant and interactive Eyes: Conjunctivae are normal.   Nose: No congestion/rhinnorhea. Mouth/Throat: Mucous membranes are moist.    Cardiovascular: Normal rate, regular rhythm. Grossly normal heart sounds.  Good peripheral circulation. Respiratory: Normal respiratory effort.  No retractions. Lungs CTAB. Gastrointestinal: Mild periumbilical tenderness to palpation, positive distention, no CVA tenderness  Genitourinary: deferred Musculoskeletal: No lower extremity tenderness nor edema.  Warm and well perfused Neurologic:  Normal speech and language. No gross focal neurologic deficits are appreciated.    Skin:  Skin is warm, dry and intact. No rash noted. Psychiatric: Mood and affect are normal. Speech and behavior are normal.  ____________________________________________   LABS (all labs ordered are listed, but only abnormal results are displayed)  Labs Reviewed  COMPREHENSIVE METABOLIC PANEL - Abnormal; Notable for the following components:      Result Value   Glucose, Bld 178 (*)    BUN 28 (*)    Creatinine, Ser 1.31 (*)    Total Bilirubin 1.3 (*)    GFR calc non Af Amer 47 (*)    GFR calc Af Amer 54 (*)    All other components within normal limits  CBC - Abnormal; Notable for the following components:   WBC 12.9 (*)    All other components within normal limits  LIPASE, BLOOD  URINALYSIS, COMPLETE (UACMP) WITH MICROSCOPIC   ____________________________________________  EKG  ED ECG REPORT I, Lavonia Drafts, the attending  physician, personally viewed and interpreted this ECG.  Date: 09/14/2017  Rhythm: normal sinus rhythm QRS Axis: normal Intervals: normal ST/T Wave abnormalities: normal Narrative Interpretation: no evidence of acute ischemia  ____________________________________________  RADIOLOGY  CT abdomen pelvis shows distended gallbladder with thickened gallbladder wall Ultrasound right upper quadrant appears consistent with a developing acute cholecystitis ____________________________________________   PROCEDURES  Procedure(s) performed: No  Procedures   Critical Care performed: No ____________________________________________   INITIAL IMPRESSION / ASSESSMENT AND PLAN / ED COURSE  Pertinent labs & imaging results that were available during my care of the patient were reviewed by me and considered in my medical decision making (see chart for details).  Patient presents with peri-umbilical abdominal pain, distention, nausea vomiting lack of flatus, concern for bowel obstruction, will obtain CT abdomen pelvis, give IV morphine IV Zofran and  reevaluate  Patient feels significant better after IV morphine.  CT scan concerning for cholecystitis, ultrasound performed as well.  ----------------------------------------- 10:26 AM on 09/14/2017 -----------------------------------------  Consulted Dr. Hampton Abbot of surgery for admission    ____________________________________________   FINAL CLINICAL IMPRESSION(S) / ED DIAGNOSES  Final diagnoses:  Upper abdominal pain  Cholecystitis, acute        Note:  This document was prepared using Dragon voice recognition software and may include unintentional dictation errors.    Lavonia Drafts, MD 09/14/17 1026

## 2017-09-14 NOTE — H&P (Signed)
Date of Admission:  09/14/2017  Reason for Admission:  Acute cholecystitis  History of Present Illness: Jesse Meadows is a 84 y.o. male who presents with a one day history of upper abdominal pain.  He went to a seafood restaurant yesterday to celebrate a birthday and started having abdominal pain right after eating.  He continued having pain overnight along with nausea and two episodes of emesis.  He tried to use the bathroom but could not have a bowel movement.  He was not able to sleep overnight due to the pain.  He presented to the ED early this morning and had another episode of emesis in the ED.  He denies any fevers but did feel chills last night.  Denies any chest pain, shortness of breath.  His pain is mostly in the mid upper abdomen as well as right upper quadrant.  He has not had episodes like this before.  Denies any dysuria or hematuria.  Past Medical History: Past Medical History:  Diagnosis Date  . Cancer ([place])    skin cancer on the nose  . Chronic kidney disease    RENAL INSUFF  . Complication of anesthesia    could not get bowels to move after back surgery hospitalized 9 days  . GERD (gastroesophageal reflux disease)   . HLD (hyperlipidemia)   . Hypertension   . Macular degeneration   . Pneumonia   . Sleep apnea      Past Surgical History: Past Surgical History:  Procedure Laterality Date  . BACK SURGERY    . CATARACT EXTRACTION, BILATERAL    . INGUINAL HERNIA REPAIR Left 01/24/2016   Procedure: HERNIA REPAIR INGUINAL ADULT;  Surgeon: Leonie Green, MD;  Location: [facility] ORS;  Service: General;  Laterality: Left;  . TOTAL HIP ARTHROPLASTY Right 12/29/2016   Procedure: TOTAL HIP ARTHROPLASTY ANTERIOR APPROACH;  Surgeon: Hessie Knows, MD;  Location: [facility] ORS;  Service: Orthopedics;  Laterality: Right;  . UVULOPALATOPHARYNGOPLASTY      Home Medications: Prior to Admission medications   Medication Sig Start Date End Date Taking? Authorizing Provider   acetaminophen (TYLENOL) 650 MG CR tablet Take 1,300 mg by mouth at bedtime.   Yes [provider]  amLODipine (NORVASC) 5 MG tablet Take 1 tablet (5 mg total) by mouth daily. 11/07/16  Yes Gladstone Lighter, MD  docusate sodium (COLACE) 100 MG capsule Take 1 capsule (100 mg total) by mouth 2 (two) times daily. Patient taking differently: Take 100 mg by mouth at bedtime.  11/06/16  Yes Gladstone Lighter, MD  furosemide (LASIX) 20 MG tablet Take 20 mg by mouth daily.    Yes [provider]  gabapentin (NEURONTIN) 600 MG tablet Take 600 mg by mouth at bedtime.   Yes [provider]  latanoprost (XALATAN) 0.005 % ophthalmic solution Place 1 drop into both eyes at bedtime.   Yes [provider]  losartan (COZAAR) 100 MG tablet Take 100 mg by mouth daily.   Yes [provider]  Multiple Vitamins-Minerals (PRESERVISION AREDS) CAPS Take 1 capsule by mouth daily.    Yes [provider]  simvastatin (ZOCOR) 20 MG tablet Take 20 mg by mouth every evening.    Yes [provider]  vitamin B-12 (CYANOCOBALAMIN) 1000 MCG tablet Take 1,000 mcg by mouth daily.   Yes [provider]  budesonide-formoterol (SYMBICORT) 80-4.5 MCG/ACT inhaler Inhale 2 puffs into the lungs 2 (two) times daily. Patient not taking: Reported on 09/14/2017 11/06/16   Gladstone Lighter, MD  enoxaparin (LOVENOX) 40 MG/0.4ML injection Inject 0.4 mLs (40 mg total) into the skin daily. Patient not taking: Reported on 09/14/2017 12/31/16   Reche Dixon, PA-C  oxyCODONE (OXY IR/ROXICODONE) 5 MG immediate release tablet Take 1 tablet (5 mg total) by mouth every 4 (four) hours as needed for breakthrough pain. Patient not taking: Reported on 09/14/2017 12/31/16   Reche Dixon, PA-C    Allergies: Allergies  Allergen Reactions  . Beta Adrenergic Blockers Other (See Comments)    Bradycardia.  Marland Kitchen Penicillins Rash and Other (See Comments)    Has patient had a PCN reaction causing  immediate rash, facial/tongue/throat swelling, SOB or lightheadedness with hypotension: No Has patient had a PCN reaction causing severe rash involving mucus membranes or skin necrosis: No Has patient had a PCN reaction that required hospitalization: No Has patient had a PCN reaction occurring within the last 10 years: No If all of the above answers are "NO", then may proceed with Cephalosporin use.     Social History:  reports that he has quit smoking. He has quit using smokeless tobacco. He reports that he does not drink alcohol or use drugs.   Family History: Family History  Problem Relation Age of Onset  . Cancer Mother   . CAD Mother   . ALS Father     Review of Systems: Review of Systems  Constitutional: Positive for chills. Negative for fever.  HENT: Negative for hearing loss.   Respiratory: Negative for shortness of breath.   Cardiovascular: Negative for chest pain.  Gastrointestinal: Positive for abdominal pain, nausea and vomiting. Negative for constipation and diarrhea.  Genitourinary: Negative for dysuria.  Musculoskeletal: Negative for myalgias.  Skin: Negative for rash.  Neurological: Negative for dizziness.  Psychiatric/Behavioral: Negative for depression.  All other systems reviewed and are negative.   Physical Exam BP 111/74   Pulse 81   Temp 98 F (36.7 C) (Oral)   Resp 18   Ht 5\' 10"  (1.778 m)   Wt 241 lb (109.3 kg)   SpO2 90%   BMI 34.58 kg/m  CONSTITUTIONAL: No acute distress HEENT:  Normocephalic, atraumatic, extraocular motion intact. NECK: Trachea is midline, and there is no jugular venous distension.  RESPIRATORY:  Lungs are clear, and breath sounds are equal bilaterally. Normal respiratory effort without pathologic use of accessory muscles. CARDIOVASCULAR: Heart is regular without murmurs, gallops, or rubs. GI: The abdomen is soft, obese, nondistended, with tenderness to palpation in upper abdomen and right upper quadrant.  Negative Murphy's  sign, but the patient recently had a dose of Morphine. There were no palpable masses.  MUSCULOSKELETAL:  Normal muscle strength and tone in all four extremities.  No peripheral edema or cyanosis. SKIN: Skin turgor is normal. There are no pathologic skin lesions.  NEUROLOGIC:  Motor and sensation is grossly normal.  Cranial nerves are grossly intact. PSYCH:  Alert and oriented to person, place and time. Affect is normal.  Laboratory Analysis: Results for orders placed or performed during the hospital encounter of 09/14/17 (from the past 24 hour(s))  Lipase, blood     Status: None   Collection Time: 09/14/17  5:13 AM  Result Value Ref Range   Lipase 49 11 - 51 U/L  Comprehensive metabolic panel     Status: Abnormal   Collection Time: 09/14/17  5:13 AM  Result Value Ref Range   Sodium 142 135 - 145 mmol/L   Potassium 4.6 3.5 - 5.1 mmol/L   Chloride 103 101 - 111 mmol/L  CO2 29 22 - 32 mmol/L   Glucose, Bld 178 (H) 65 - 99 mg/dL   BUN 28 (H) 6 - 20 mg/dL   Creatinine, Ser 1.31 (H) 0.61 - 1.24 mg/dL   Calcium 9.8 8.9 - 10.3 mg/dL   Total Protein 8.1 6.5 - 8.1 g/dL   Albumin 5.0 3.5 - 5.0 g/dL   AST 32 15 - 41 U/L   ALT 22 17 - 63 U/L   Alkaline Phosphatase 69 38 - 126 U/L   Total Bilirubin 1.3 (H) 0.3 - 1.2 mg/dL   GFR calc non Af Amer 47 (L) >60 mL/min   GFR calc Af Amer 54 (L) >60 mL/min   Anion gap 10 5 - 15  CBC     Status: Abnormal   Collection Time: 09/14/17  6:12 AM  Result Value Ref Range   WBC 12.9 (H) 3.8 - 10.6 K/uL   RBC 5.22 4.40 - 5.90 MIL/uL   Hemoglobin 16.2 13.0 - 18.0 g/dL   HCT 48.6 40.0 - 52.0 %   MCV 93.2 80.0 - 100.0 fL   MCH 31.0 26.0 - 34.0 pg   MCHC 33.2 32.0 - 36.0 g/dL   RDW 14.1 11.5 - 14.5 %   Platelets 164 150 - 440 K/uL  Urinalysis, Complete w Microscopic     Status: Abnormal   Collection Time: 09/14/17 10:07 AM  Result Value Ref Range   Color, Urine YELLOW (A) YELLOW   APPearance CLEAR (A) CLEAR   Specific Gravity, Urine >1.046 (H) 1.005 -  1.030   pH 6.0 5.0 - 8.0   Glucose, UA NEGATIVE NEGATIVE mg/dL   Hgb urine dipstick NEGATIVE NEGATIVE   Bilirubin Urine NEGATIVE NEGATIVE   Ketones, ur NEGATIVE NEGATIVE mg/dL   Protein, ur NEGATIVE NEGATIVE mg/dL   Nitrite NEGATIVE NEGATIVE   Leukocytes, UA NEGATIVE NEGATIVE   RBC / HPF 0-5 0 - 5 RBC/hpf   WBC, UA 0-5 0 - 5 WBC/hpf   Bacteria, UA NONE SEEN NONE SEEN   Squamous Epithelial / LPF NONE SEEN NONE SEEN   Mucus PRESENT     Imaging: Ct Abdomen Pelvis W Contrast  Result Date: 09/14/2017 CLINICAL DATA:  Fatty food ingestion yesterday followed by abdominal pain, and nausea. EXAM: CT ABDOMEN AND PELVIS WITH CONTRAST TECHNIQUE: Multidetector CT imaging of the abdomen and pelvis was performed using the standard protocol following bolus administration of intravenous contrast. CONTRAST:  123mL ISOVUE-300 IOPAMIDOL (ISOVUE-300) INJECTION 61% COMPARISON:  11/15/2015. FINDINGS: Lower chest: Bibasilar lung scarring. Calcified pleural plaque, on the RIGHT. Nodular density medial RIGHT lung base, 7 x 7 mm, stable. Hepatobiliary: No focal liver abnormality is seen. There is cholelithiasis, noted on the previous study. There is gallbladder wall thickening up to 5 mm. There may be mild pericholecystic fluid. There is no biliary ductal dilatation. Pancreas: Unremarkable. No pancreatic ductal dilatation or surrounding inflammatory changes. Spleen: Normal in size without focal abnormality. Adrenals/Urinary Tract: Adrenal glands are unremarkable. Kidneys are normal, without renal calculi, focal lesion, or hydronephrosis. Bladder is unremarkable. Multiple cysts are seen throughout the kidney, but no solid mass. The cysts appear essentially unchanged from priors. Stomach/Bowel: Stomach is within normal limits. Appendix appears normal. No evidence of bowel wall thickening, distention, or inflammatory changes. Moderate stool burden, particularly RIGHT colon. Vascular/Lymphatic: Aortic atherosclerosis. Thrombus is  present in the distal aorta, similar to priors. No aneurysmal dilatation. Reproductive: Prostatic enlargement.  Bladder is mildly distended. Other: No abdominal wall hernia or abnormality. No abdominopelvic ascites. Musculoskeletal: Lumbar spondylosis.  Previous lumbar decompression advanced facet arthropathy. Dextroscoliosis. IMPRESSION: Cholelithiasis, with gallbladder wall thickening and possible pericholecystic fluid suggesting acute cholecystitis. No biliary ductal dilatation. Consider RIGHT upper quadrant sonography for further characterization as clinically indicated. Stable calcified pleural plaque RIGHT lung base, and stable associated subcentimeter nodular density. These results were called by telephone at the time of interpretation on 09/14/2017 at 8:58 am to Dr. Lavonia Drafts , who verbally acknowledged these results. Electronically Signed   By: Staci Righter M.D.   On: 09/14/2017 09:02   US Abdomen Limited Ruq  Result Date: 09/14/2017 CLINICAL DATA:  Upper abdominal pain for 1 day EXAM: ULTRASOUND ABDOMEN LIMITED RIGHT UPPER QUADRANT COMPARISON:  CT abdomen pelvis of 09/14/2016 FINDINGS: Gallbladder: The gallbladder is visualized and is somewhat distended. Small gallstones are present none larger than 7 mm in diameter. The patient was medicated, but the was some discomfort upon compression over the gallbladder. The gallbladder wall is slightly thickened. Common bile duct: Diameter: Common bile duct is normal measuring 3 mm in diameter, partially obscured by overlying bowel gas. Liver: The parenchyma of the liver is normal in echogenicity. No focal hepatic abnormality is seen. Portal vein is patent on color Doppler imaging with normal direction of blood flow towards the liver. IMPRESSION: 1. Small gallstones. The gallbladder is somewhat distended with slight prominence of the gallbladder wall of 4 mm in diameter. Mild discomfort with compression, but the patient was medicated. With comparison with  today's CT, developing %period%acute cholecystitis is a definite consideration. 2. No abnormality of the liver is seen. Electronically Signed   By: Ivar Drape M.D.   On: 09/14/2017 10:17    Assessment and Plan: This is a 84 y.o. male who presents with acute cholecystitis.  I have independently viewed the patient's imaging studies and reviewed his laboratory studies.  Overall, his WBC is mildly elevated to 12.9 and he is a little dehydrated compared to baseline.  His CT scan does show a distended gallbladder with some wall thickening and pericholecystic fluid.  His ultrasound confirmed cholelithiasis as well as some wall thickening and distention.  Discussed with the patient that he likely has acute cholecystitis rather than just biliary colic.  His pain still remains despite that it's better with the pain medication.  After discussing options of treatment with antibiotic alone with the risk of recurrence, and surgical management, he has opted for surgical management.  Discussed the role of laparoscopic cholecystectomy, including risks of bleeding, infection, injury to surrounding structures, and the possibility of having to convert to an open procedure.  He is willing to proceed.  He has been NPO since last night and will be going to OR soon.  Will order Cipro and Flagyl given that he has allergy to PCN.  He will have IV fluids, appropriate pain and nausea control.  Patient understands this plan and all of his questions have been answered.   Melvyn Neth, [place]

## 2017-09-14 NOTE — ED Triage Notes (Signed)
Pt arrived to the ED accompanied by family for complaints of LUQ abdominal pain x1 day. Pt reports that the pain started after eating and that it has continued to worsen through the night. Pt reports that the last couple of days his bm's have been really small and hard.   Pt is AOx4 in no apparent distress.

## 2017-09-14 NOTE — Care Management (Addendum)
Met with patient and his daughter Jaime at bedside in ED.  I provided information to patient about [facility] for review; consult placed.  He uses a cane sometimes for ambulation. O2 is acute.  His family live close by to him and support him however it sounded like he lives alone.  He went to [facility] for rehab last year.  RNCM to follow.  

## 2017-09-14 NOTE — Transfer of Care (Signed)
Immediate Anesthesia Transfer of Care Note  Patient: Jesse Meadows  Procedure(s) Performed: LAPAROSCOPIC CHOLECYSTECTOMY (N/A )  Patient Location: PACU  Anesthesia Type:General  Level of Consciousness: awake  Airway & Oxygen Therapy: Patient Spontanous Breathing and Patient connected to face mask oxygen  Post-op Assessment: Report given to RN and Post -op Vital signs reviewed and stable  Post vital signs: Reviewed and stable  Last Vitals:  Vitals Value Taken Time  BP 153/69 09/14/2017  4:55 PM  Temp 37.4 C 09/14/2017  4:55 PM  Pulse 77 09/14/2017  4:55 PM  Resp 16 09/14/2017  4:55 PM  SpO2 97 % 09/14/2017  4:55 PM  Vitals shown include unvalidated device data.  Last Pain:  Vitals:   09/14/17 1402  TempSrc: Tympanic  PainSc: 0-No pain      Patients Stated Pain Goal: 0 (43/27/61 4709)  Complications: No apparent anesthesia complications

## 2017-09-14 NOTE — Anesthesia Procedure Notes (Signed)
Procedure Name: Intubation Date/Time: 09/14/2017 2:32 PM Performed by: Allean Found, CRNA Pre-anesthesia Checklist: Patient identified, Emergency Drugs available, Suction available, Patient being monitored and Timeout performed Patient Re-evaluated:Patient Re-evaluated prior to induction Oxygen Delivery Method: Circle system utilized Preoxygenation: Pre-oxygenation with 100% oxygen Induction Type: IV induction Ventilation: Mask ventilation without difficulty Laryngoscope Size: McGraph and 4 Grade View: Grade II Tube type: Oral Tube size: 7.5 mm Number of attempts: 1 Airway Equipment and Method: Stylet Placement Confirmation: ETT inserted through vocal cords under direct vision,  positive ETCO2 and breath sounds checked- equal and bilateral Secured at: 23 cm Tube secured with: Tape Dental Injury: Teeth and Oropharynx as per pre-operative assessment

## 2017-09-14 NOTE — Anesthesia Preprocedure Evaluation (Addendum)
Anesthesia Evaluation  Patient identified by MRN, date of birth, ID band Patient awake  General Assessment Comment:Prolonged constipation post anesthesia/surgery  Reviewed: Allergy & Precautions, H&P , NPO status , reviewed documented beta blocker date and time   History of Anesthesia Complications (+) history of anesthetic complications  Airway Mallampati: III  TM Distance: >3 FB     Dental  (+) Edentulous Upper, Edentulous Lower, Upper Dentures   Pulmonary sleep apnea , pneumonia, resolved, former smoker,    Pulmonary exam normal        Cardiovascular hypertension, Normal cardiovascular exam     Neuro/Psych  Neuromuscular disease negative psych ROS   GI/Hepatic Neg liver ROS, GERD  Controlled,  Endo/Other    Renal/GU CRFRenal disease     Musculoskeletal   Abdominal   Peds  Hematology   Anesthesia Other Findings Past Medical History: Diagnosis Date . Cancer ([place])   skin cancer on the nose . Chronic kidney disease   RENAL INSUFF . Complication of anesthesia   could not get bowels to move after back surgery hospitalized 9 days . GERD (gastroesophageal reflux disease)  . HLD (hyperlipidemia)  . Hypertension  . Macular degeneration  . Pneumonia  . Sleep apnea    Patient Active Problem List  Diagnosis Date Noted . Primary localized osteoarthritis of right hip 12/29/2016 . Pressure injury of skin 11/04/2016 . Sepsis ([place]) 11/03/2016 . HCAP (healthcare-associated pneumonia) 11/03/2016 . HLD (hyperlipidemia) 11/03/2016 . DEHYDRATION 08/27/2010 . LABRYNTHITIS 08/27/2010 . INSOMNIA UNSPECIFIED 08/27/2010 . Essential hypertension 08/14/2010 . ALLERGIC RHINITIS CAUSE UNSPECIFIED 08/14/2010 . SCIATICA, RIGHT 08/14/2010   Past Surgical History: Procedure Laterality Date . BACK SURGERY   . CATARACT EXTRACTION, BILATERAL   . INGUINAL HERNIA REPAIR Left 01/24/2016  Procedure: HERNIA  REPAIR INGUINAL ADULT;  Surgeon: Leonie Green, MD;  Location: [facility] ORS;  Service: General;  Laterality: Left; . TOTAL HIP ARTHROPLASTY Right 12/29/2016  Procedure: TOTAL HIP ARTHROPLASTY ANTERIOR APPROACH;  Surgeon: Hessie Knows, MD;  Location: [facility] ORS;  Service: Orthopedics;  Laterality: Right; . UVULOPALATOPHARYNGOPLASTY       Reproductive/Obstetrics                            Anesthesia Physical Anesthesia Plan  ASA: III  Anesthesia Plan: General ETT   Post-op Pain Management:    Induction:   PONV Risk Score and Plan: 3 and Ondansetron and Promethazine  Airway Management Planned:   Additional Equipment:   Intra-op Plan:   Post-operative Plan:   Informed Consent: I have reviewed the patients History and Physical, chart, labs and discussed the procedure including the risks, benefits and alternatives for the proposed anesthesia with the patient or authorized representative who has indicated his/her understanding and acceptance.   Dental Advisory Given  Plan Discussed with: CRNA  Anesthesia Plan Comments:        Anesthesia Quick Evaluation

## 2017-09-14 NOTE — ED Notes (Signed)
Taken to OR by OR transport. Shirt sent with family member. Pt alert and oriented X4, active, cooperative, pt in NAD. RR even and unlabored, color WNL.  Went on portable oxygen

## 2017-09-14 NOTE — ED Notes (Signed)
CT staff at bedside.  

## 2017-09-15 ENCOUNTER — Encounter: Payer: Self-pay | Admitting: Surgery

## 2017-09-15 LAB — CBC
HCT: 44.6 % (ref 40.0–52.0)
HEMOGLOBIN: 14.8 g/dL (ref 13.0–18.0)
MCH: 31.3 pg (ref 26.0–34.0)
MCHC: 33.2 g/dL (ref 32.0–36.0)
MCV: 94.1 fL (ref 80.0–100.0)
Platelets: 137 10*3/uL — ABNORMAL LOW (ref 150–440)
RBC: 4.75 MIL/uL (ref 4.40–5.90)
RDW: 14.4 % (ref 11.5–14.5)
WBC: 12.3 10*3/uL — AB (ref 3.8–10.6)

## 2017-09-15 LAB — BASIC METABOLIC PANEL
ANION GAP: 8 (ref 5–15)
BUN: 32 mg/dL — AB (ref 6–20)
CO2: 29 mmol/L (ref 22–32)
Calcium: 8.1 mg/dL — ABNORMAL LOW (ref 8.9–10.3)
Chloride: 101 mmol/L (ref 101–111)
Creatinine, Ser: 1.82 mg/dL — ABNORMAL HIGH (ref 0.61–1.24)
GFR, EST AFRICAN AMERICAN: 36 mL/min — AB (ref >60)
GFR, EST NON AFRICAN AMERICAN: 31 mL/min — AB (ref >60)
Glucose, Bld: 156 mg/dL — ABNORMAL HIGH (ref 65–99)
Potassium: 4.6 mmol/L (ref 3.5–5.1)
SODIUM: 138 mmol/L (ref 135–145)

## 2017-09-15 LAB — MAGNESIUM: MAGNESIUM: 2 mg/dL (ref 1.7–2.4)

## 2017-09-15 MED ORDER — METRONIDAZOLE 500 MG PO TABS: 500.0000 mg | ORAL_TABLET | Freq: Three times a day (TID) | ORAL | 0 refills | Status: AC

## 2017-09-15 MED ORDER — CIPROFLOXACIN HCL 500 MG PO TABS: 500.0000 mg | ORAL_TABLET | Freq: Two times a day (BID) | ORAL | 0 refills | Status: AC

## 2017-09-15 MED ORDER — SODIUM CHLORIDE 0.9 % IV SOLN
INTRAVENOUS | Status: DC
  Administered 2017-09-15: 09:00:00 via INTRAVENOUS

## 2017-09-15 MED ORDER — OXYCODONE HCL 5 MG PO TABS: 5.0000 mg | ORAL_TABLET | ORAL | 0 refills | Status: AC | PRN

## 2017-09-15 NOTE — Discharge Summary (Signed)
Patient ID: Jesse Meadows MRN: [id number] DOB/AGE: October 08, 1926 84 y.o.  Admit date: 09/14/2017 Discharge date: 09/15/2017   Discharge Diagnoses:  Active Problems:   Cholecystitis, acute   Procedures:  Laparoscopic cholecystectomy  Hospital Course:   Patient was admitted on 4/2 with acute cholecystitis and was brought to the OR on same day.  He underwent a laparoscopic cholecystectomy.  He tolerated the procedure well.  Post-op, his diet was advanced, he was given IV fluids for hydration, and appropriate pain control.  He was tolerating his diet, his pain was well controlled, and was ambulating and voiding without issues.  He was deemed ready for discharge.  On exam, he was in no acute distress with stable normal vital signs.  His abdomen was soft, obese, non-distended, and appropriately tender to palpation.  Incisions were clean, dry, and intact with DermaBond.  Consults: None  Disposition: Discharge disposition: 01-Home or Self Care       Discharge Instructions    Call MD for:  difficulty breathing, headache or visual disturbances   Complete by:  As directed    Call MD for:  persistant nausea and vomiting   Complete by:  As directed    Call MD for:  redness, tenderness, or signs of infection (pain, swelling, redness, odor or green/yellow discharge around incision site)   Complete by:  As directed    Call MD for:  severe uncontrolled pain   Complete by:  As directed    Call MD for:  temperature >100.4   Complete by:  As directed    Diet - low sodium heart healthy   Complete by:  As directed    Discharge instructions   Complete by:  As directed    1.  Patient may shower, but do not scrub wound heavily and dab dry only. 2.  Do not submerge wounds in pool/tub 3.  Do not apply ointments or hydrogen peroxide to the wounds.   Driving Restrictions   Complete by:  As directed    Do not drive while taking narcotics for pain control.   Increase activity slowly   Complete by:  As  directed    Lifting restrictions   Complete by:  As directed    No heavy lifting or pushing of more than 10-15 lbs for 4 weeks.   No dressing needed   Complete by:  As directed      Allergies as of 09/15/2017      Reactions   Beta Adrenergic Blockers Other (See Comments)   Bradycardia.   Penicillins Rash, Other (See Comments)   Has patient had a PCN reaction causing immediate rash, facial/tongue/throat swelling, SOB or lightheadedness with hypotension: No Has patient had a PCN reaction causing severe rash involving mucus membranes or skin necrosis: No Has patient had a PCN reaction that required hospitalization: No Has patient had a PCN reaction occurring within the last 10 years: No If all of the above answers are "NO", then may proceed with Cephalosporin use.      Medication List    TAKE these medications   acetaminophen 650 MG CR tablet Commonly known as:  TYLENOL Take 1,300 mg by mouth at bedtime.   amLODipine 5 MG tablet Commonly known as:  NORVASC Take 1 tablet (5 mg total) by mouth daily.   budesonide-formoterol 80-4.5 MCG/ACT inhaler Commonly known as:  SYMBICORT Inhale 2 puffs into the lungs 2 (two) times daily.   ciprofloxacin 500 MG tablet Commonly known as:  CIPRO Take 1 tablet (  500 mg total) by mouth 2 (two) times daily for 8 days.   docusate sodium 100 MG capsule Commonly known as:  COLACE Take 1 capsule (100 mg total) by mouth 2 (two) times daily. What changed:  when to take this   furosemide 20 MG tablet Commonly known as:  LASIX Take 20 mg by mouth daily.   gabapentin 600 MG tablet Commonly known as:  NEURONTIN Take 600 mg by mouth at bedtime.   latanoprost 0.005 % ophthalmic solution Commonly known as:  XALATAN Place 1 drop into both eyes at bedtime.   losartan 100 MG tablet Commonly known as:  COZAAR Take 100 mg by mouth daily.   metroNIDAZOLE 500 MG tablet Commonly known as:  FLAGYL Take 1 tablet (500 mg total) by mouth 3 (three) times  daily for 8 days.   oxyCODONE 5 MG immediate release tablet Commonly known as:  Oxy IR/ROXICODONE Take 1 tablet (5 mg total) by mouth every 4 (four) hours as needed for moderate pain or severe pain.   PRESERVISION AREDS Caps Take 1 capsule by mouth daily.   simvastatin 20 MG tablet Commonly known as:  ZOCOR Take 20 mg by mouth every evening.   vitamin B-12 1000 MCG tablet Commonly known as:  CYANOCOBALAMIN Take 1,000 mcg by mouth daily.      Follow-up Information    , Jacqulyn Bath, MD Follow up in 2 week(s).   Specialty:  Surgery Contact information: [address] [place] [phone]

## 2017-09-15 NOTE — Progress Notes (Signed)
Ambulatory sats of 81% on RA. Pt refusing to have oxygen setup. MD aware and stated as long as this was okay with family that it would be okay.

## 2017-09-15 NOTE — Care Management (Signed)
Notified by RN that patient has qualifying sats for home O2.  Patient has declined home o2.  RNCM spoke with daughter who the patient lives with and she also declines home O2.  States "he has had it before and it is hard to make him wear it".  RNCM and bedside RN have notified attending of patient's decision to decline O2.  RN was informed to proceed with discharge.   A referral has been made to [facility].  Jancie from [facility] notified of discharge.  RNCM signing off.

## 2017-09-15 NOTE — Progress Notes (Addendum)
Notified Dr. Hampton Abbot of BP 111/51. Per MD only give amlodepine. Hold lasix and losartan.

## 2017-09-15 NOTE — Progress Notes (Signed)
Jesse Meadows and O x 4. VSS. Pt tolerating diet well. No complaints of pain or nausea. IV removed intact, prescriptions given. Pt voiced understanding of discharge instructions with no further questions. Pt discharged via wheelchair. Pt refused home O2 to be set up.   Lynann Bologna MSN, RN-BC  Allergies as of 09/15/2017      Reactions   Beta Adrenergic Blockers Other (See Comments)   Bradycardia.   Penicillins Rash, Other (See Comments)   Has patient had Meadows PCN reaction causing immediate rash, facial/tongue/throat swelling, SOB or lightheadedness with hypotension: No Has patient had Meadows PCN reaction causing severe rash involving mucus membranes or skin necrosis: No Has patient had Meadows PCN reaction that required hospitalization: No Has patient had Meadows PCN reaction occurring within the last 10 years: No If all of the above answers are "NO", then may proceed with Cephalosporin use.      Medication List    TAKE these medications   acetaminophen 650 MG CR tablet Commonly known as:  TYLENOL Take 1,300 mg by mouth at bedtime.   amLODipine 5 MG tablet Commonly known as:  NORVASC Take 1 tablet (5 mg total) by mouth daily.   budesonide-formoterol 80-4.5 MCG/ACT inhaler Commonly known as:  SYMBICORT Inhale 2 puffs into the lungs 2 (two) times daily.   ciprofloxacin 500 MG tablet Commonly known as:  CIPRO Take 1 tablet (500 mg total) by mouth 2 (two) times daily for 8 days.   docusate sodium 100 MG capsule Commonly known as:  COLACE Take 1 capsule (100 mg total) by mouth 2 (two) times daily. What changed:  when to take this   furosemide 20 MG tablet Commonly known as:  LASIX Take 20 mg by mouth daily.   gabapentin 600 MG tablet Commonly known as:  NEURONTIN Take 600 mg by mouth at bedtime.   latanoprost 0.005 % ophthalmic solution Commonly known as:  XALATAN Place 1 drop into both eyes at bedtime.   losartan 100 MG tablet Commonly known as:  COZAAR Take 100 mg by mouth daily.   metroNIDAZOLE 500 MG tablet Commonly known as:  FLAGYL Take 1 tablet (500 mg total) by mouth 3 (three) times daily for 8 days.   oxyCODONE 5 MG immediate release tablet Commonly known as:  Oxy IR/ROXICODONE Take 1 tablet (5 mg total) by mouth every 4 (four) hours as needed for moderate pain or severe pain.   PRESERVISION AREDS Caps Take 1 capsule by mouth daily.   simvastatin 20 MG tablet Commonly known as:  ZOCOR Take 20 mg by mouth every evening.   vitamin B-12 1000 MCG tablet Commonly known as:  CYANOCOBALAMIN Take 1,000 mcg by mouth daily.       Vitals:   09/15/17 0951 09/15/17 1206  BP: (!) 111/51 (!) 110/47  Pulse: 63 66  Resp: 17 19  Temp:  98 F (36.7 C)  SpO2: 93% 94%

## 2017-09-15 NOTE — Progress Notes (Signed)
SATURATION QUALIFICATIONS: (This note is used to comply with regulatory documentation for home oxygen)  Patient Saturations on Room Air at Rest = 89%  Patient Saturations on Room Air while Ambulating = 81%  Patient Saturations on 2 Liters of oxygen while Ambulating = 92%  Please briefly explain why patient needs home oxygen:

## 2017-09-17 LAB — SURGICAL PATHOLOGY

## 2017-09-24 ENCOUNTER — Other Ambulatory Visit: Payer: Self-pay

## 2017-09-24 DIAGNOSIS — I10 Essential (primary) hypertension: Secondary | ICD-10-CM | POA: Insufficient documentation

## 2017-09-24 DIAGNOSIS — G4730 Sleep apnea, unspecified: Secondary | ICD-10-CM | POA: Insufficient documentation

## 2017-09-24 DIAGNOSIS — M1990 Unspecified osteoarthritis, unspecified site: Secondary | ICD-10-CM | POA: Insufficient documentation

## 2017-09-24 DIAGNOSIS — E785 Hyperlipidemia, unspecified: Secondary | ICD-10-CM | POA: Insufficient documentation

## 2017-09-29 ENCOUNTER — Ambulatory Visit (INDEPENDENT_AMBULATORY_CARE_PROVIDER_SITE_OTHER): Payer: PPO | Admitting: Surgery

## 2017-09-29 ENCOUNTER — Encounter: Payer: Self-pay | Admitting: Surgery

## 2017-09-29 VITALS — BP 154/75 | HR 75 | Temp 97.9°F | Ht 70.0 in | Wt 243.2 lb

## 2017-09-29 DIAGNOSIS — K8000 Calculus of gallbladder with acute cholecystitis without obstruction: Secondary | ICD-10-CM

## 2017-09-29 NOTE — Patient Instructions (Signed)

## 2017-09-29 NOTE — Progress Notes (Signed)
Outpatient postop visit  09/29/2017  Jesse Meadows is an 84 y.o. male.    Procedure: lap chole  DX:IPJAS well  HPI: S/P lap chole for severe acute cholecystitis.  Patient states is feeling better and is gradually getting his strength back.  He wants to mow his yard.  Medications reviewed.    Physical Exam:  There were no vitals taken for this visit.    PE: No icterus no jaundice patient walks with a cane.  Abdomen is soft nontender wounds are clean no erythema or drainage    Assessment/Plan:  Pathology confirms severe acute cholecystitis with mucosal necrosis.  Patient doing very well at this point recommend follow-up on an as-needed basis.  Minded of no heavy lifting.  Florene Glen, MD, FACS

## 2017-12-03 DIAGNOSIS — Z85828 Personal history of other malignant neoplasm of skin: Secondary | ICD-10-CM | POA: Diagnosis not present

## 2017-12-03 DIAGNOSIS — X32XXXA Exposure to sunlight, initial encounter: Secondary | ICD-10-CM | POA: Diagnosis not present

## 2017-12-03 DIAGNOSIS — Z08 Encounter for follow-up examination after completed treatment for malignant neoplasm: Secondary | ICD-10-CM | POA: Diagnosis not present

## 2017-12-03 DIAGNOSIS — L570 Actinic keratosis: Secondary | ICD-10-CM | POA: Diagnosis not present

## 2017-12-03 DIAGNOSIS — L821 Other seborrheic keratosis: Secondary | ICD-10-CM | POA: Diagnosis not present

## 2018-02-09 DIAGNOSIS — Z125 Encounter for screening for malignant neoplasm of prostate: Secondary | ICD-10-CM | POA: Diagnosis not present

## 2018-02-09 DIAGNOSIS — M150 Primary generalized (osteo)arthritis: Secondary | ICD-10-CM | POA: Diagnosis not present

## 2018-02-09 DIAGNOSIS — Z96641 Presence of right artificial hip joint: Secondary | ICD-10-CM | POA: Diagnosis not present

## 2018-02-09 DIAGNOSIS — E7800 Pure hypercholesterolemia, unspecified: Secondary | ICD-10-CM | POA: Diagnosis not present

## 2018-02-09 DIAGNOSIS — I10 Essential (primary) hypertension: Secondary | ICD-10-CM | POA: Diagnosis not present

## 2018-02-16 DIAGNOSIS — H547 Unspecified visual loss: Secondary | ICD-10-CM | POA: Diagnosis not present

## 2018-02-16 DIAGNOSIS — I10 Essential (primary) hypertension: Secondary | ICD-10-CM | POA: Diagnosis not present

## 2018-02-16 DIAGNOSIS — R972 Elevated prostate specific antigen [PSA]: Secondary | ICD-10-CM | POA: Diagnosis not present

## 2018-02-16 DIAGNOSIS — Z6837 Body mass index (BMI) 37.0-37.9, adult: Secondary | ICD-10-CM | POA: Diagnosis not present

## 2018-02-16 DIAGNOSIS — M5416 Radiculopathy, lumbar region: Secondary | ICD-10-CM | POA: Diagnosis not present

## 2018-02-16 DIAGNOSIS — E7800 Pure hypercholesterolemia, unspecified: Secondary | ICD-10-CM | POA: Diagnosis not present

## 2018-02-16 DIAGNOSIS — G4733 Obstructive sleep apnea (adult) (pediatric): Secondary | ICD-10-CM | POA: Diagnosis not present

## 2018-02-16 DIAGNOSIS — D696 Thrombocytopenia, unspecified: Secondary | ICD-10-CM | POA: Diagnosis not present

## 2018-05-27 DIAGNOSIS — J069 Acute upper respiratory infection, unspecified: Secondary | ICD-10-CM | POA: Diagnosis not present

## 2018-07-27 DIAGNOSIS — H353134 Nonexudative age-related macular degeneration, bilateral, advanced atrophic with subfoveal involvement: Secondary | ICD-10-CM | POA: Diagnosis not present

## 2018-08-04 DIAGNOSIS — H547 Unspecified visual loss: Secondary | ICD-10-CM | POA: Diagnosis not present

## 2018-08-04 DIAGNOSIS — G4733 Obstructive sleep apnea (adult) (pediatric): Secondary | ICD-10-CM | POA: Diagnosis not present

## 2018-08-04 DIAGNOSIS — D696 Thrombocytopenia, unspecified: Secondary | ICD-10-CM | POA: Diagnosis not present

## 2018-08-04 DIAGNOSIS — E7800 Pure hypercholesterolemia, unspecified: Secondary | ICD-10-CM | POA: Diagnosis not present

## 2018-08-04 DIAGNOSIS — M5416 Radiculopathy, lumbar region: Secondary | ICD-10-CM | POA: Diagnosis not present

## 2018-08-04 DIAGNOSIS — I10 Essential (primary) hypertension: Secondary | ICD-10-CM | POA: Diagnosis not present

## 2018-08-04 DIAGNOSIS — R972 Elevated prostate specific antigen [PSA]: Secondary | ICD-10-CM | POA: Diagnosis not present

## 2018-08-11 DIAGNOSIS — E7800 Pure hypercholesterolemia, unspecified: Secondary | ICD-10-CM | POA: Diagnosis not present

## 2018-08-11 DIAGNOSIS — Z96641 Presence of right artificial hip joint: Secondary | ICD-10-CM | POA: Diagnosis not present

## 2018-08-11 DIAGNOSIS — D696 Thrombocytopenia, unspecified: Secondary | ICD-10-CM | POA: Diagnosis not present

## 2018-08-11 DIAGNOSIS — I10 Essential (primary) hypertension: Secondary | ICD-10-CM | POA: Diagnosis not present

## 2018-08-11 DIAGNOSIS — Z0000 Encounter for general adult medical examination without abnormal findings: Secondary | ICD-10-CM | POA: Diagnosis not present

## 2018-08-11 DIAGNOSIS — M150 Primary generalized (osteo)arthritis: Secondary | ICD-10-CM | POA: Diagnosis not present

## 2018-08-11 DIAGNOSIS — H543 Unqualified visual loss, both eyes: Secondary | ICD-10-CM | POA: Diagnosis not present

## 2018-08-11 DIAGNOSIS — Z6837 Body mass index (BMI) 37.0-37.9, adult: Secondary | ICD-10-CM | POA: Diagnosis not present

## 2018-12-07 DIAGNOSIS — L570 Actinic keratosis: Secondary | ICD-10-CM | POA: Diagnosis not present

## 2018-12-07 DIAGNOSIS — Z08 Encounter for follow-up examination after completed treatment for malignant neoplasm: Secondary | ICD-10-CM | POA: Diagnosis not present

## 2018-12-07 DIAGNOSIS — Z85828 Personal history of other malignant neoplasm of skin: Secondary | ICD-10-CM | POA: Diagnosis not present

## 2018-12-07 DIAGNOSIS — D0439 Carcinoma in situ of skin of other parts of face: Secondary | ICD-10-CM | POA: Diagnosis not present

## 2018-12-07 DIAGNOSIS — D485 Neoplasm of uncertain behavior of skin: Secondary | ICD-10-CM | POA: Diagnosis not present

## 2018-12-07 DIAGNOSIS — X32XXXA Exposure to sunlight, initial encounter: Secondary | ICD-10-CM | POA: Diagnosis not present

## 2019-02-02 DIAGNOSIS — Z0000 Encounter for general adult medical examination without abnormal findings: Secondary | ICD-10-CM | POA: Diagnosis not present

## 2019-02-02 DIAGNOSIS — M8949 Other hypertrophic osteoarthropathy, multiple sites: Secondary | ICD-10-CM | POA: Diagnosis not present

## 2019-02-02 DIAGNOSIS — I10 Essential (primary) hypertension: Secondary | ICD-10-CM | POA: Diagnosis not present

## 2019-02-02 DIAGNOSIS — D696 Thrombocytopenia, unspecified: Secondary | ICD-10-CM | POA: Diagnosis not present

## 2019-02-02 DIAGNOSIS — Z96641 Presence of right artificial hip joint: Secondary | ICD-10-CM | POA: Diagnosis not present

## 2019-02-02 DIAGNOSIS — Z6837 Body mass index (BMI) 37.0-37.9, adult: Secondary | ICD-10-CM | POA: Diagnosis not present

## 2019-02-02 DIAGNOSIS — Z125 Encounter for screening for malignant neoplasm of prostate: Secondary | ICD-10-CM | POA: Diagnosis not present

## 2019-02-02 DIAGNOSIS — E7800 Pure hypercholesterolemia, unspecified: Secondary | ICD-10-CM | POA: Diagnosis not present

## 2019-02-03 DIAGNOSIS — H6122 Impacted cerumen, left ear: Secondary | ICD-10-CM | POA: Diagnosis not present

## 2019-02-03 DIAGNOSIS — J22 Unspecified acute lower respiratory infection: Secondary | ICD-10-CM | POA: Diagnosis not present

## 2019-02-03 DIAGNOSIS — R05 Cough: Secondary | ICD-10-CM | POA: Diagnosis not present

## 2019-02-03 DIAGNOSIS — Z03818 Encounter for observation for suspected exposure to other biological agents ruled out: Secondary | ICD-10-CM | POA: Diagnosis not present

## 2019-02-03 DIAGNOSIS — J029 Acute pharyngitis, unspecified: Secondary | ICD-10-CM | POA: Diagnosis not present

## 2019-02-09 DIAGNOSIS — I10 Essential (primary) hypertension: Secondary | ICD-10-CM | POA: Diagnosis not present

## 2019-02-09 DIAGNOSIS — D696 Thrombocytopenia, unspecified: Secondary | ICD-10-CM | POA: Diagnosis not present

## 2019-02-09 DIAGNOSIS — M8949 Other hypertrophic osteoarthropathy, multiple sites: Secondary | ICD-10-CM | POA: Diagnosis not present

## 2019-02-09 DIAGNOSIS — E7800 Pure hypercholesterolemia, unspecified: Secondary | ICD-10-CM | POA: Diagnosis not present

## 2019-02-09 DIAGNOSIS — Z6837 Body mass index (BMI) 37.0-37.9, adult: Secondary | ICD-10-CM | POA: Diagnosis not present

## 2019-02-09 DIAGNOSIS — R972 Elevated prostate specific antigen [PSA]: Secondary | ICD-10-CM | POA: Diagnosis not present

## 2019-02-09 DIAGNOSIS — H543 Unqualified visual loss, both eyes: Secondary | ICD-10-CM | POA: Diagnosis not present

## 2019-04-23 IMAGING — CT CT ABD-PELV W/ CM
2 of 5 series · 16 of 46 positions shown, 18 images · IV contrast (APPLIED)
Comparison: 11/15/2015.

CLINICAL DATA: Fatty food ingestion yesterday followed by abdominal
pain, and nausea.

EXAM:
CT ABDOMEN AND PELVIS WITH CONTRAST
TECHNIQUE: Multidetector CT imaging of the abdomen and pelvis was performed
using the standard protocol following bolus administration of
intravenous contrast.
CONTRAST:  100mL [id number] IOPAMIDOL ([id number]-SGG) INJECTION 61%

[Series 2: routine abd/pel with · axial · 0.89mm/px · z∈[-1043,-578]mm · 13 of 105 slices shown, 15 images]
[im 6/105  soft-tissue]
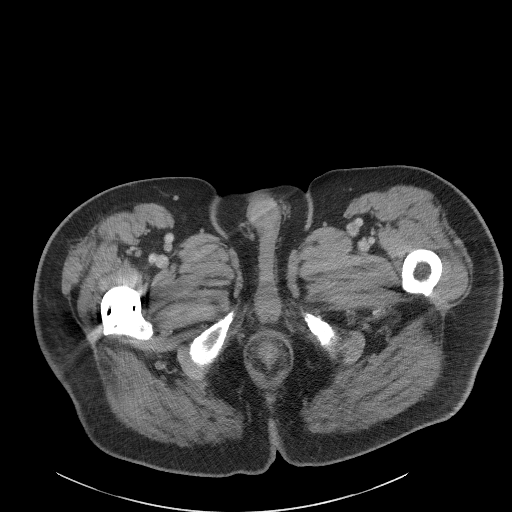
[im 6/105  bone]
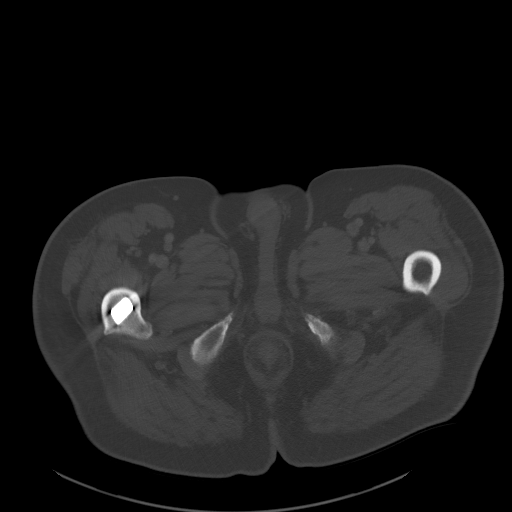
[im 16/105  soft-tissue]
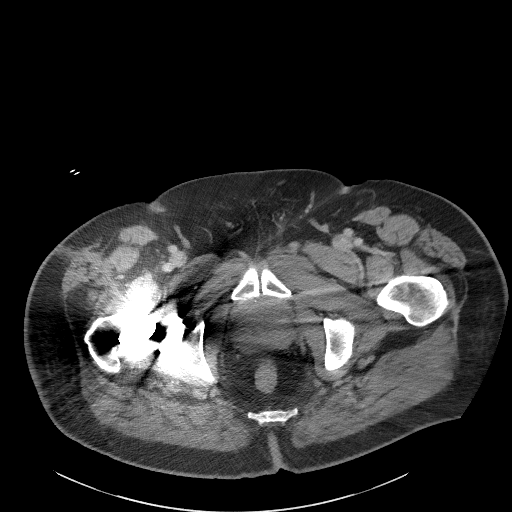
[im 21/105  soft-tissue]
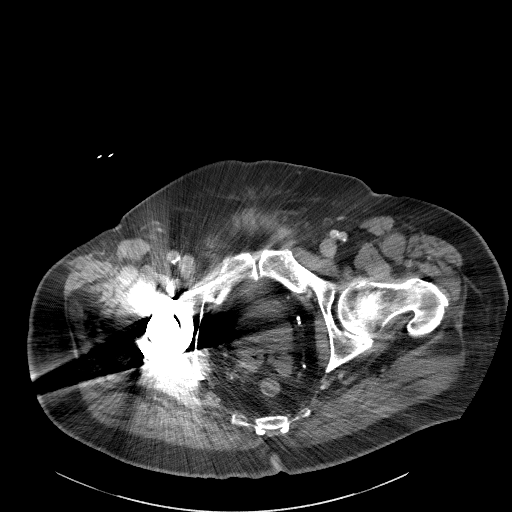
[im 32/105  soft-tissue]
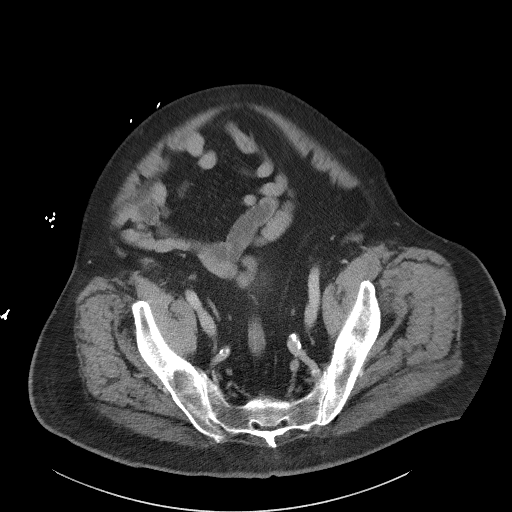
[im 37/105  soft-tissue]
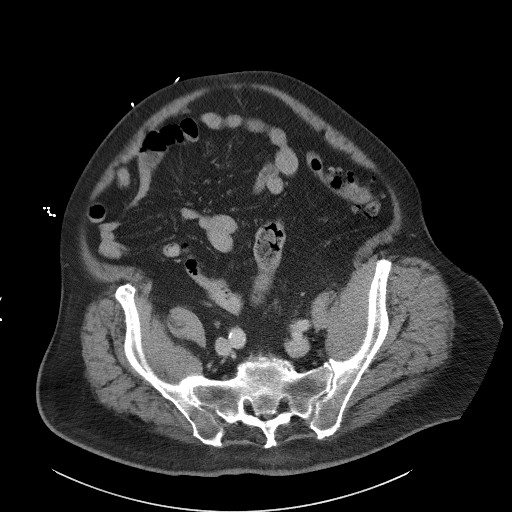
[im 47/105  soft-tissue]
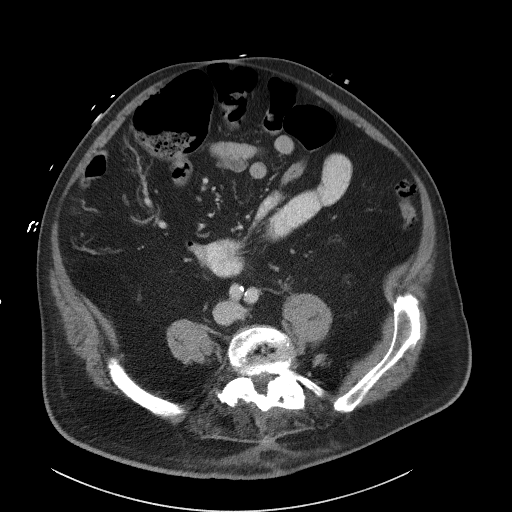
[im 53/105  soft-tissue]
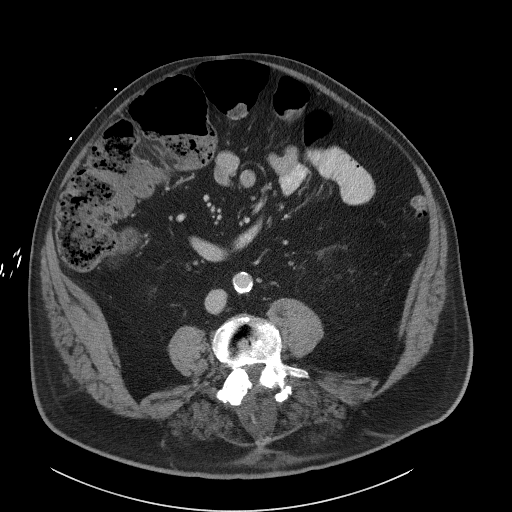
[im 58/105  soft-tissue]
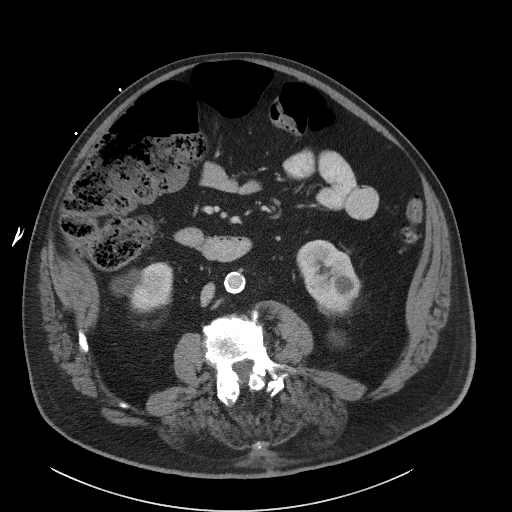
[im 68/105  soft-tissue]
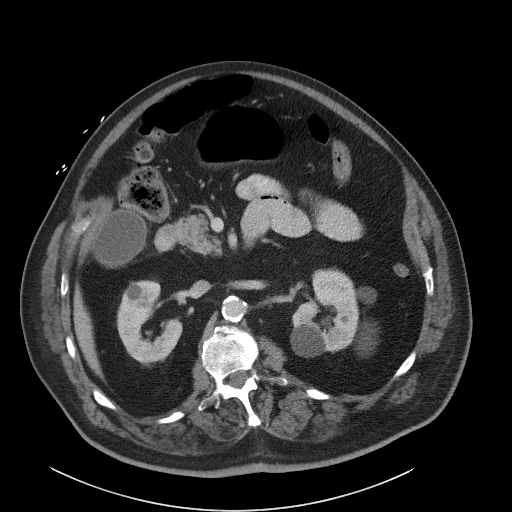
[im 68/105  bone]
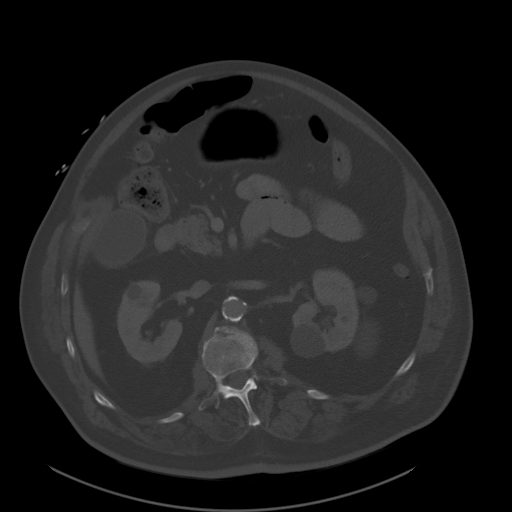
[im 73/105  soft-tissue]
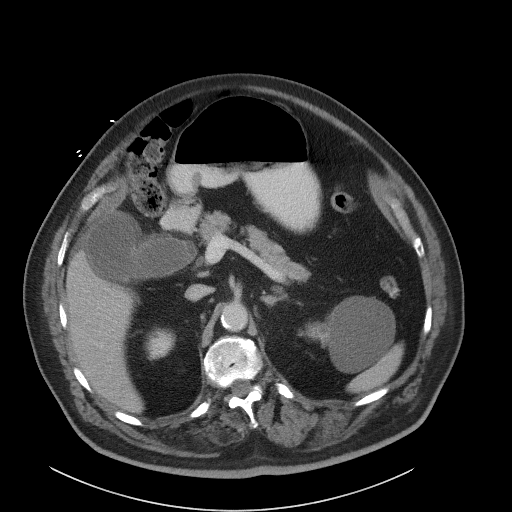
[im 84/105  soft-tissue]
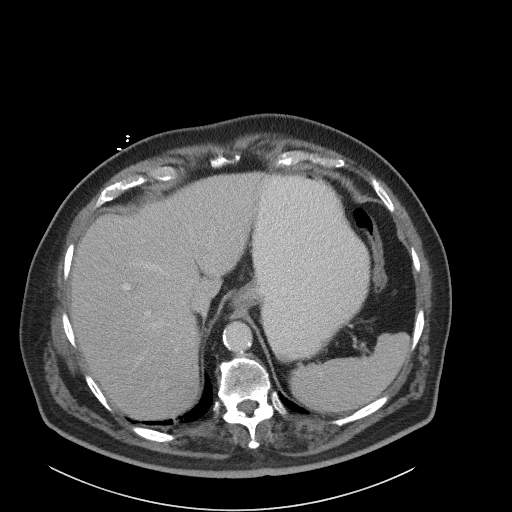
[im 89/105  soft-tissue]
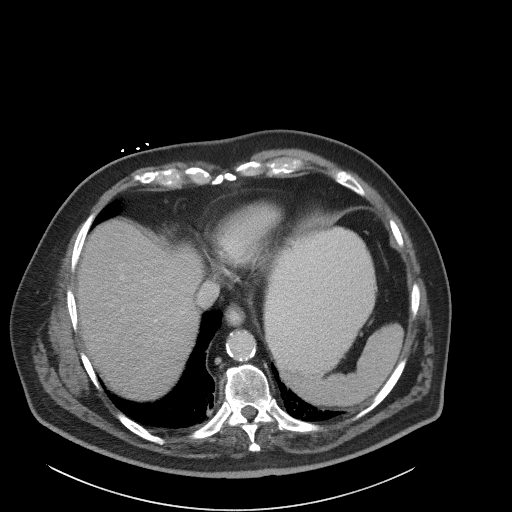
[im 99/105  soft-tissue]
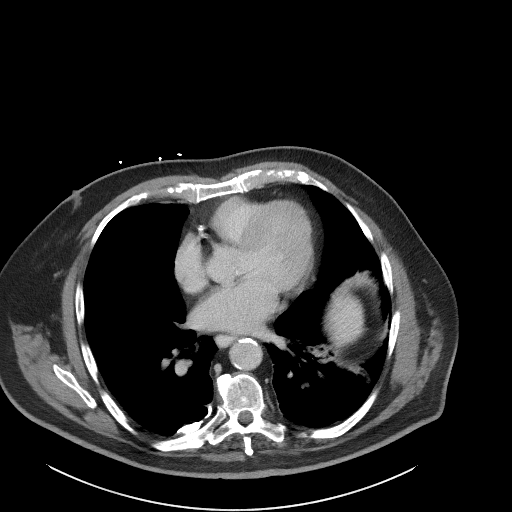

[Series 6: coronal st · coronal · 0.86mm/px · 3 of 125 slices shown]
[im 42/125  soft-tissue]
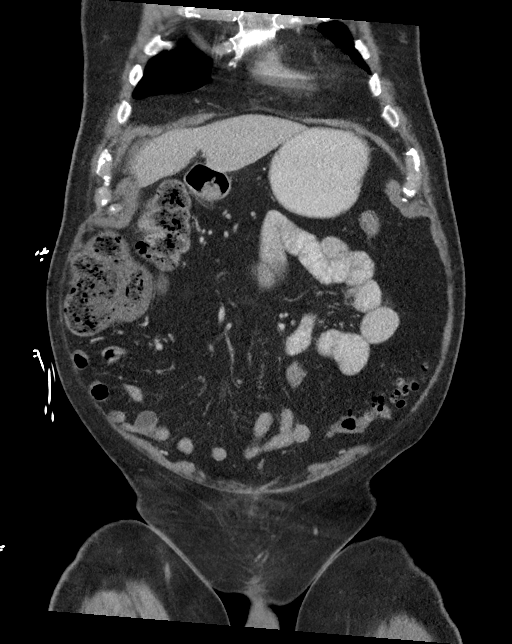
[im 56/125  soft-tissue]
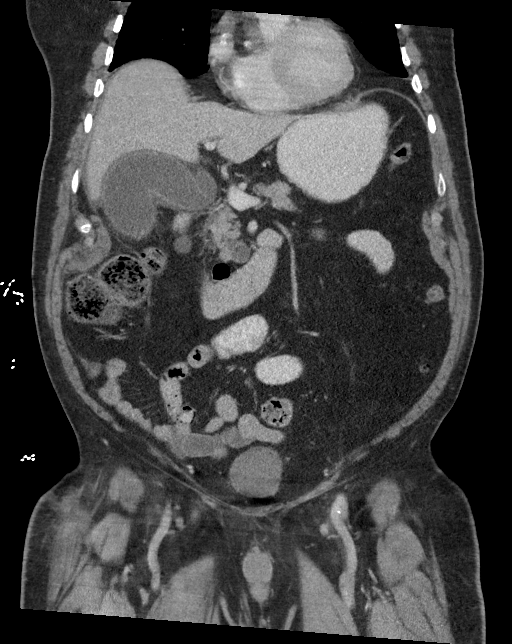
[im 69/125  soft-tissue]
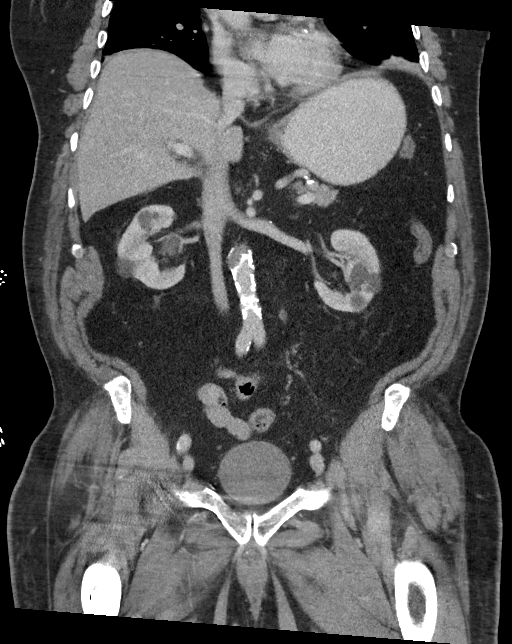

[16 of 46 positions shown; findings below may reference images not displayed]

FINDINGS: Lower chest: Bibasilar lung scarring. Calcified pleural plaque, on
the RIGHT. Nodular density medial RIGHT lung base, 7 x 7 mm, stable.

Hepatobiliary: No focal liver abnormality is seen. There is
cholelithiasis, noted on the previous study. There is gallbladder
wall thickening up to 5 mm. There may be mild pericholecystic fluid.
There is no biliary ductal dilatation.

Pancreas: Unremarkable. No pancreatic ductal dilatation or
surrounding inflammatory changes.

Spleen: Normal in size without focal abnormality.

Adrenals/Urinary Tract: Adrenal glands are unremarkable. Kidneys are
normal, without renal calculi, focal lesion, or hydronephrosis.
Bladder is unremarkable. Multiple cysts are seen throughout the
kidney, but no solid mass. The cysts appear essentially unchanged
from priors.

Stomach/Bowel: Stomach is within normal limits. Appendix appears
normal. No evidence of bowel wall thickening, distention, or
inflammatory changes. Moderate stool burden, particularly RIGHT
colon.

Vascular/Lymphatic: Aortic atherosclerosis. Thrombus is present in
the distal aorta, similar to priors. No aneurysmal dilatation.

Reproductive: Prostatic enlargement.  Bladder is mildly distended.

Other: No abdominal wall hernia or abnormality. No abdominopelvic
ascites.

Musculoskeletal: Lumbar spondylosis. Previous lumbar decompression
advanced facet arthropathy. Dextroscoliosis.
IMPRESSION: Cholelithiasis, with gallbladder wall thickening and possible
pericholecystic fluid suggesting acute cholecystitis. No biliary
ductal dilatation. Consider RIGHT upper quadrant sonography for
further characterization as clinically indicated.

Stable calcified pleural plaque RIGHT lung base, and stable
associated subcentimeter nodular density.

These results were called by telephone at the time of interpretation
on 09/14/2017 at [DATE] to Dr. TUYOSHI SEGAWA , who verbally
acknowledged these results.

## 2019-06-22 DIAGNOSIS — L821 Other seborrheic keratosis: Secondary | ICD-10-CM | POA: Diagnosis not present

## 2019-06-22 DIAGNOSIS — D0439 Carcinoma in situ of skin of other parts of face: Secondary | ICD-10-CM | POA: Diagnosis not present

## 2019-06-22 DIAGNOSIS — Z08 Encounter for follow-up examination after completed treatment for malignant neoplasm: Secondary | ICD-10-CM | POA: Diagnosis not present

## 2019-06-22 DIAGNOSIS — C44329 Squamous cell carcinoma of skin of other parts of face: Secondary | ICD-10-CM | POA: Diagnosis not present

## 2019-06-22 DIAGNOSIS — D2262 Melanocytic nevi of left upper limb, including shoulder: Secondary | ICD-10-CM | POA: Diagnosis not present

## 2019-06-22 DIAGNOSIS — Z85828 Personal history of other malignant neoplasm of skin: Secondary | ICD-10-CM | POA: Diagnosis not present

## 2019-06-22 DIAGNOSIS — D485 Neoplasm of uncertain behavior of skin: Secondary | ICD-10-CM | POA: Diagnosis not present

## 2019-06-22 DIAGNOSIS — Z872 Personal history of diseases of the skin and subcutaneous tissue: Secondary | ICD-10-CM | POA: Diagnosis not present

## 2019-06-22 DIAGNOSIS — D2261 Melanocytic nevi of right upper limb, including shoulder: Secondary | ICD-10-CM | POA: Diagnosis not present

## 2019-06-22 DIAGNOSIS — D225 Melanocytic nevi of trunk: Secondary | ICD-10-CM | POA: Diagnosis not present

## 2019-07-12 DIAGNOSIS — C44329 Squamous cell carcinoma of skin of other parts of face: Secondary | ICD-10-CM | POA: Diagnosis not present

## 2019-08-02 DIAGNOSIS — H401131 Primary open-angle glaucoma, bilateral, mild stage: Secondary | ICD-10-CM | POA: Diagnosis not present

## 2019-08-07 DIAGNOSIS — E7800 Pure hypercholesterolemia, unspecified: Secondary | ICD-10-CM | POA: Diagnosis not present

## 2019-08-07 DIAGNOSIS — I10 Essential (primary) hypertension: Secondary | ICD-10-CM | POA: Diagnosis not present

## 2019-08-07 DIAGNOSIS — H543 Unqualified visual loss, both eyes: Secondary | ICD-10-CM | POA: Diagnosis not present

## 2019-08-07 DIAGNOSIS — M8949 Other hypertrophic osteoarthropathy, multiple sites: Secondary | ICD-10-CM | POA: Diagnosis not present

## 2019-08-07 DIAGNOSIS — Z6837 Body mass index (BMI) 37.0-37.9, adult: Secondary | ICD-10-CM | POA: Diagnosis not present

## 2019-08-07 DIAGNOSIS — R972 Elevated prostate specific antigen [PSA]: Secondary | ICD-10-CM | POA: Diagnosis not present

## 2019-08-07 DIAGNOSIS — D696 Thrombocytopenia, unspecified: Secondary | ICD-10-CM | POA: Diagnosis not present

## 2019-08-14 DIAGNOSIS — H547 Unspecified visual loss: Secondary | ICD-10-CM | POA: Diagnosis not present

## 2019-08-14 DIAGNOSIS — Z6837 Body mass index (BMI) 37.0-37.9, adult: Secondary | ICD-10-CM | POA: Diagnosis not present

## 2019-08-14 DIAGNOSIS — F330 Major depressive disorder, recurrent, mild: Secondary | ICD-10-CM | POA: Diagnosis not present

## 2019-08-14 DIAGNOSIS — E7800 Pure hypercholesterolemia, unspecified: Secondary | ICD-10-CM | POA: Diagnosis not present

## 2019-08-14 DIAGNOSIS — D696 Thrombocytopenia, unspecified: Secondary | ICD-10-CM | POA: Diagnosis not present

## 2019-08-14 DIAGNOSIS — Z0000 Encounter for general adult medical examination without abnormal findings: Secondary | ICD-10-CM | POA: Diagnosis not present

## 2019-08-14 DIAGNOSIS — I10 Essential (primary) hypertension: Secondary | ICD-10-CM | POA: Diagnosis not present

## 2019-08-14 DIAGNOSIS — M8949 Other hypertrophic osteoarthropathy, multiple sites: Secondary | ICD-10-CM | POA: Diagnosis not present

## 2019-08-14 DIAGNOSIS — R972 Elevated prostate specific antigen [PSA]: Secondary | ICD-10-CM | POA: Diagnosis not present

## 2019-08-28 DIAGNOSIS — D0439 Carcinoma in situ of skin of other parts of face: Secondary | ICD-10-CM | POA: Diagnosis not present

## 2019-08-28 DIAGNOSIS — L578 Other skin changes due to chronic exposure to nonionizing radiation: Secondary | ICD-10-CM | POA: Diagnosis not present

## 2020-01-31 DIAGNOSIS — H401131 Primary open-angle glaucoma, bilateral, mild stage: Secondary | ICD-10-CM | POA: Diagnosis not present

## 2020-02-07 DIAGNOSIS — H547 Unspecified visual loss: Secondary | ICD-10-CM | POA: Diagnosis not present

## 2020-02-07 DIAGNOSIS — D696 Thrombocytopenia, unspecified: Secondary | ICD-10-CM | POA: Diagnosis not present

## 2020-02-07 DIAGNOSIS — M8949 Other hypertrophic osteoarthropathy, multiple sites: Secondary | ICD-10-CM | POA: Diagnosis not present

## 2020-02-07 DIAGNOSIS — Z0000 Encounter for general adult medical examination without abnormal findings: Secondary | ICD-10-CM | POA: Diagnosis not present

## 2020-02-07 DIAGNOSIS — E7800 Pure hypercholesterolemia, unspecified: Secondary | ICD-10-CM | POA: Diagnosis not present

## 2020-02-07 DIAGNOSIS — Z125 Encounter for screening for malignant neoplasm of prostate: Secondary | ICD-10-CM | POA: Diagnosis not present

## 2020-02-07 DIAGNOSIS — Z6837 Body mass index (BMI) 37.0-37.9, adult: Secondary | ICD-10-CM | POA: Diagnosis not present

## 2020-02-07 DIAGNOSIS — R972 Elevated prostate specific antigen [PSA]: Secondary | ICD-10-CM | POA: Diagnosis not present

## 2020-02-14 DIAGNOSIS — M8949 Other hypertrophic osteoarthropathy, multiple sites: Secondary | ICD-10-CM | POA: Diagnosis not present

## 2020-02-14 DIAGNOSIS — I10 Essential (primary) hypertension: Secondary | ICD-10-CM | POA: Diagnosis not present

## 2020-02-14 DIAGNOSIS — Z6835 Body mass index (BMI) 35.0-35.9, adult: Secondary | ICD-10-CM | POA: Diagnosis not present

## 2020-02-14 DIAGNOSIS — F331 Major depressive disorder, recurrent, moderate: Secondary | ICD-10-CM | POA: Diagnosis not present

## 2020-02-14 DIAGNOSIS — R972 Elevated prostate specific antigen [PSA]: Secondary | ICD-10-CM | POA: Diagnosis not present

## 2020-02-14 DIAGNOSIS — G4700 Insomnia, unspecified: Secondary | ICD-10-CM | POA: Diagnosis not present

## 2020-02-14 DIAGNOSIS — D696 Thrombocytopenia, unspecified: Secondary | ICD-10-CM | POA: Diagnosis not present

## 2020-02-14 DIAGNOSIS — H547 Unspecified visual loss: Secondary | ICD-10-CM | POA: Diagnosis not present

## 2020-06-24 DIAGNOSIS — J9811 Atelectasis: Secondary | ICD-10-CM | POA: Diagnosis not present

## 2020-06-24 DIAGNOSIS — R059 Cough, unspecified: Secondary | ICD-10-CM | POA: Diagnosis not present

## 2020-06-24 DIAGNOSIS — Z20822 Contact with and (suspected) exposure to covid-19: Secondary | ICD-10-CM | POA: Diagnosis not present

## 2020-06-24 DIAGNOSIS — R058 Other specified cough: Secondary | ICD-10-CM | POA: Diagnosis not present

## 2020-06-24 DIAGNOSIS — J069 Acute upper respiratory infection, unspecified: Secondary | ICD-10-CM | POA: Diagnosis not present

## 2020-08-07 DIAGNOSIS — H353134 Nonexudative age-related macular degeneration, bilateral, advanced atrophic with subfoveal involvement: Secondary | ICD-10-CM | POA: Diagnosis not present

## 2020-08-13 DIAGNOSIS — Z6835 Body mass index (BMI) 35.0-35.9, adult: Secondary | ICD-10-CM | POA: Diagnosis not present

## 2020-08-13 DIAGNOSIS — D696 Thrombocytopenia, unspecified: Secondary | ICD-10-CM | POA: Diagnosis not present

## 2020-08-13 DIAGNOSIS — M8949 Other hypertrophic osteoarthropathy, multiple sites: Secondary | ICD-10-CM | POA: Diagnosis not present

## 2020-08-13 DIAGNOSIS — H547 Unspecified visual loss: Secondary | ICD-10-CM | POA: Diagnosis not present

## 2020-08-13 DIAGNOSIS — I10 Essential (primary) hypertension: Secondary | ICD-10-CM | POA: Diagnosis not present

## 2020-08-13 DIAGNOSIS — R972 Elevated prostate specific antigen [PSA]: Secondary | ICD-10-CM | POA: Diagnosis not present

## 2020-08-15 DIAGNOSIS — I10 Essential (primary) hypertension: Secondary | ICD-10-CM | POA: Diagnosis not present

## 2020-08-15 DIAGNOSIS — Z0000 Encounter for general adult medical examination without abnormal findings: Secondary | ICD-10-CM | POA: Diagnosis not present

## 2020-08-15 DIAGNOSIS — F331 Major depressive disorder, recurrent, moderate: Secondary | ICD-10-CM | POA: Diagnosis not present

## 2020-08-15 DIAGNOSIS — H548 Legal blindness, as defined in USA: Secondary | ICD-10-CM | POA: Diagnosis not present

## 2020-08-15 DIAGNOSIS — D696 Thrombocytopenia, unspecified: Secondary | ICD-10-CM | POA: Diagnosis not present

## 2020-08-15 DIAGNOSIS — Z6835 Body mass index (BMI) 35.0-35.9, adult: Secondary | ICD-10-CM | POA: Diagnosis not present

## 2020-08-15 DIAGNOSIS — R262 Difficulty in walking, not elsewhere classified: Secondary | ICD-10-CM | POA: Diagnosis not present

## 2020-08-15 DIAGNOSIS — M8949 Other hypertrophic osteoarthropathy, multiple sites: Secondary | ICD-10-CM | POA: Diagnosis not present

## 2020-08-15 DIAGNOSIS — R972 Elevated prostate specific antigen [PSA]: Secondary | ICD-10-CM | POA: Diagnosis not present

## 2020-09-25 DIAGNOSIS — L538 Other specified erythematous conditions: Secondary | ICD-10-CM | POA: Diagnosis not present

## 2020-09-25 DIAGNOSIS — X32XXXA Exposure to sunlight, initial encounter: Secondary | ICD-10-CM | POA: Diagnosis not present

## 2020-09-25 DIAGNOSIS — D485 Neoplasm of uncertain behavior of skin: Secondary | ICD-10-CM | POA: Diagnosis not present

## 2020-09-25 DIAGNOSIS — C44311 Basal cell carcinoma of skin of nose: Secondary | ICD-10-CM | POA: Diagnosis not present

## 2020-09-25 DIAGNOSIS — L570 Actinic keratosis: Secondary | ICD-10-CM | POA: Diagnosis not present

## 2020-09-25 DIAGNOSIS — R208 Other disturbances of skin sensation: Secondary | ICD-10-CM | POA: Diagnosis not present

## 2020-09-25 DIAGNOSIS — L820 Inflamed seborrheic keratosis: Secondary | ICD-10-CM | POA: Diagnosis not present

## 2020-12-04 DIAGNOSIS — C44311 Basal cell carcinoma of skin of nose: Secondary | ICD-10-CM | POA: Diagnosis not present

## 2020-12-17 DIAGNOSIS — F331 Major depressive disorder, recurrent, moderate: Secondary | ICD-10-CM | POA: Diagnosis not present

## 2020-12-17 DIAGNOSIS — M159 Polyosteoarthritis, unspecified: Secondary | ICD-10-CM | POA: Diagnosis not present

## 2020-12-17 DIAGNOSIS — Z6835 Body mass index (BMI) 35.0-35.9, adult: Secondary | ICD-10-CM | POA: Diagnosis not present

## 2020-12-17 DIAGNOSIS — Z0000 Encounter for general adult medical examination without abnormal findings: Secondary | ICD-10-CM | POA: Diagnosis not present

## 2020-12-17 DIAGNOSIS — D696 Thrombocytopenia, unspecified: Secondary | ICD-10-CM | POA: Diagnosis not present

## 2020-12-17 DIAGNOSIS — R262 Difficulty in walking, not elsewhere classified: Secondary | ICD-10-CM | POA: Diagnosis not present

## 2020-12-17 DIAGNOSIS — H548 Legal blindness, as defined in USA: Secondary | ICD-10-CM | POA: Diagnosis not present

## 2020-12-17 DIAGNOSIS — I10 Essential (primary) hypertension: Secondary | ICD-10-CM | POA: Diagnosis not present

## 2020-12-17 DIAGNOSIS — R972 Elevated prostate specific antigen [PSA]: Secondary | ICD-10-CM | POA: Diagnosis not present

## 2020-12-24 DIAGNOSIS — Z0000 Encounter for general adult medical examination without abnormal findings: Secondary | ICD-10-CM | POA: Diagnosis not present

## 2020-12-24 DIAGNOSIS — Z6834 Body mass index (BMI) 34.0-34.9, adult: Secondary | ICD-10-CM | POA: Diagnosis not present

## 2020-12-24 DIAGNOSIS — M159 Polyosteoarthritis, unspecified: Secondary | ICD-10-CM | POA: Diagnosis not present

## 2020-12-24 DIAGNOSIS — R262 Difficulty in walking, not elsewhere classified: Secondary | ICD-10-CM | POA: Diagnosis not present

## 2020-12-24 DIAGNOSIS — F331 Major depressive disorder, recurrent, moderate: Secondary | ICD-10-CM | POA: Diagnosis not present

## 2020-12-24 DIAGNOSIS — D696 Thrombocytopenia, unspecified: Secondary | ICD-10-CM | POA: Diagnosis not present

## 2020-12-24 DIAGNOSIS — K5900 Constipation, unspecified: Secondary | ICD-10-CM | POA: Diagnosis not present

## 2020-12-24 DIAGNOSIS — R972 Elevated prostate specific antigen [PSA]: Secondary | ICD-10-CM | POA: Diagnosis not present

## 2020-12-24 DIAGNOSIS — H548 Legal blindness, as defined in USA: Secondary | ICD-10-CM | POA: Diagnosis not present

## 2020-12-24 DIAGNOSIS — I10 Essential (primary) hypertension: Secondary | ICD-10-CM | POA: Diagnosis not present

## 2021-04-24 DIAGNOSIS — R262 Difficulty in walking, not elsewhere classified: Secondary | ICD-10-CM | POA: Diagnosis not present

## 2021-04-24 DIAGNOSIS — R972 Elevated prostate specific antigen [PSA]: Secondary | ICD-10-CM | POA: Diagnosis not present

## 2021-04-24 DIAGNOSIS — D696 Thrombocytopenia, unspecified: Secondary | ICD-10-CM | POA: Diagnosis not present

## 2021-04-24 DIAGNOSIS — M159 Polyosteoarthritis, unspecified: Secondary | ICD-10-CM | POA: Diagnosis not present

## 2021-04-24 DIAGNOSIS — K5900 Constipation, unspecified: Secondary | ICD-10-CM | POA: Diagnosis not present

## 2021-04-24 DIAGNOSIS — Z6834 Body mass index (BMI) 34.0-34.9, adult: Secondary | ICD-10-CM | POA: Diagnosis not present

## 2021-04-24 DIAGNOSIS — H548 Legal blindness, as defined in USA: Secondary | ICD-10-CM | POA: Diagnosis not present

## 2021-04-24 DIAGNOSIS — I10 Essential (primary) hypertension: Secondary | ICD-10-CM | POA: Diagnosis not present

## 2021-04-24 DIAGNOSIS — Z125 Encounter for screening for malignant neoplasm of prostate: Secondary | ICD-10-CM | POA: Diagnosis not present

## 2021-04-24 DIAGNOSIS — F331 Major depressive disorder, recurrent, moderate: Secondary | ICD-10-CM | POA: Diagnosis not present

## 2021-05-01 DIAGNOSIS — Z6835 Body mass index (BMI) 35.0-35.9, adult: Secondary | ICD-10-CM | POA: Diagnosis not present

## 2021-05-01 DIAGNOSIS — F331 Major depressive disorder, recurrent, moderate: Secondary | ICD-10-CM | POA: Diagnosis not present

## 2021-05-01 DIAGNOSIS — Z96641 Presence of right artificial hip joint: Secondary | ICD-10-CM | POA: Diagnosis not present

## 2021-05-01 DIAGNOSIS — M79675 Pain in left toe(s): Secondary | ICD-10-CM | POA: Diagnosis not present

## 2021-05-01 DIAGNOSIS — I10 Essential (primary) hypertension: Secondary | ICD-10-CM | POA: Diagnosis not present

## 2021-05-01 DIAGNOSIS — R972 Elevated prostate specific antigen [PSA]: Secondary | ICD-10-CM | POA: Diagnosis not present

## 2021-05-01 DIAGNOSIS — H543 Unqualified visual loss, both eyes: Secondary | ICD-10-CM | POA: Diagnosis not present

## 2021-05-01 DIAGNOSIS — G4700 Insomnia, unspecified: Secondary | ICD-10-CM | POA: Diagnosis not present

## 2021-05-20 DIAGNOSIS — G609 Hereditary and idiopathic neuropathy, unspecified: Secondary | ICD-10-CM | POA: Diagnosis not present

## 2021-05-20 DIAGNOSIS — M79674 Pain in right toe(s): Secondary | ICD-10-CM | POA: Diagnosis not present

## 2021-05-20 DIAGNOSIS — B351 Tinea unguium: Secondary | ICD-10-CM | POA: Diagnosis not present

## 2021-05-20 DIAGNOSIS — M79675 Pain in left toe(s): Secondary | ICD-10-CM | POA: Diagnosis not present

## 2021-07-03 DIAGNOSIS — L570 Actinic keratosis: Secondary | ICD-10-CM | POA: Diagnosis not present

## 2021-07-03 DIAGNOSIS — L820 Inflamed seborrheic keratosis: Secondary | ICD-10-CM | POA: Diagnosis not present

## 2021-07-03 DIAGNOSIS — D485 Neoplasm of uncertain behavior of skin: Secondary | ICD-10-CM | POA: Diagnosis not present

## 2021-08-07 DIAGNOSIS — H353134 Nonexudative age-related macular degeneration, bilateral, advanced atrophic with subfoveal involvement: Secondary | ICD-10-CM | POA: Diagnosis not present

## 2021-08-14 DIAGNOSIS — R208 Other disturbances of skin sensation: Secondary | ICD-10-CM | POA: Diagnosis not present

## 2021-08-14 DIAGNOSIS — L820 Inflamed seborrheic keratosis: Secondary | ICD-10-CM | POA: Diagnosis not present

## 2021-08-14 DIAGNOSIS — L538 Other specified erythematous conditions: Secondary | ICD-10-CM | POA: Diagnosis not present

## 2021-10-22 DIAGNOSIS — H543 Unqualified visual loss, both eyes: Secondary | ICD-10-CM | POA: Diagnosis not present

## 2021-10-22 DIAGNOSIS — Z6835 Body mass index (BMI) 35.0-35.9, adult: Secondary | ICD-10-CM | POA: Diagnosis not present

## 2021-10-22 DIAGNOSIS — G4700 Insomnia, unspecified: Secondary | ICD-10-CM | POA: Diagnosis not present

## 2021-10-22 DIAGNOSIS — F331 Major depressive disorder, recurrent, moderate: Secondary | ICD-10-CM | POA: Diagnosis not present

## 2021-10-22 DIAGNOSIS — M79675 Pain in left toe(s): Secondary | ICD-10-CM | POA: Diagnosis not present

## 2021-10-22 DIAGNOSIS — I10 Essential (primary) hypertension: Secondary | ICD-10-CM | POA: Diagnosis not present

## 2021-10-22 DIAGNOSIS — Z96641 Presence of right artificial hip joint: Secondary | ICD-10-CM | POA: Diagnosis not present

## 2021-10-22 DIAGNOSIS — R972 Elevated prostate specific antigen [PSA]: Secondary | ICD-10-CM | POA: Diagnosis not present

## 2021-10-29 DIAGNOSIS — F331 Major depressive disorder, recurrent, moderate: Secondary | ICD-10-CM | POA: Diagnosis not present

## 2021-10-29 DIAGNOSIS — D696 Thrombocytopenia, unspecified: Secondary | ICD-10-CM | POA: Diagnosis not present

## 2021-10-29 DIAGNOSIS — Z6836 Body mass index (BMI) 36.0-36.9, adult: Secondary | ICD-10-CM | POA: Diagnosis not present

## 2021-10-29 DIAGNOSIS — Z0000 Encounter for general adult medical examination without abnormal findings: Secondary | ICD-10-CM | POA: Diagnosis not present

## 2021-10-29 DIAGNOSIS — H543 Unqualified visual loss, both eyes: Secondary | ICD-10-CM | POA: Diagnosis not present

## 2021-10-29 DIAGNOSIS — I10 Essential (primary) hypertension: Secondary | ICD-10-CM | POA: Diagnosis not present

## 2021-10-29 DIAGNOSIS — E7800 Pure hypercholesterolemia, unspecified: Secondary | ICD-10-CM | POA: Diagnosis not present

## 2021-10-29 DIAGNOSIS — R972 Elevated prostate specific antigen [PSA]: Secondary | ICD-10-CM | POA: Diagnosis not present

## 2021-10-29 DIAGNOSIS — N1831 Chronic kidney disease, stage 3a: Secondary | ICD-10-CM | POA: Diagnosis not present

## 2022-03-16 DIAGNOSIS — D485 Neoplasm of uncertain behavior of skin: Secondary | ICD-10-CM | POA: Diagnosis not present

## 2022-03-16 DIAGNOSIS — L820 Inflamed seborrheic keratosis: Secondary | ICD-10-CM | POA: Diagnosis not present

## 2022-03-16 DIAGNOSIS — L570 Actinic keratosis: Secondary | ICD-10-CM | POA: Diagnosis not present

## 2022-03-16 DIAGNOSIS — X32XXXA Exposure to sunlight, initial encounter: Secondary | ICD-10-CM | POA: Diagnosis not present

## 2022-03-16 DIAGNOSIS — L821 Other seborrheic keratosis: Secondary | ICD-10-CM | POA: Diagnosis not present

## 2022-04-24 DIAGNOSIS — I10 Essential (primary) hypertension: Secondary | ICD-10-CM | POA: Diagnosis not present

## 2022-04-24 DIAGNOSIS — Z6836 Body mass index (BMI) 36.0-36.9, adult: Secondary | ICD-10-CM | POA: Diagnosis not present

## 2022-04-24 DIAGNOSIS — E7800 Pure hypercholesterolemia, unspecified: Secondary | ICD-10-CM | POA: Diagnosis not present

## 2022-04-24 DIAGNOSIS — D696 Thrombocytopenia, unspecified: Secondary | ICD-10-CM | POA: Diagnosis not present

## 2022-04-24 DIAGNOSIS — Z125 Encounter for screening for malignant neoplasm of prostate: Secondary | ICD-10-CM | POA: Diagnosis not present

## 2022-04-24 DIAGNOSIS — R7309 Other abnormal glucose: Secondary | ICD-10-CM | POA: Diagnosis not present

## 2022-04-24 DIAGNOSIS — Z0000 Encounter for general adult medical examination without abnormal findings: Secondary | ICD-10-CM | POA: Diagnosis not present

## 2022-04-24 DIAGNOSIS — H543 Unqualified visual loss, both eyes: Secondary | ICD-10-CM | POA: Diagnosis not present

## 2022-04-24 DIAGNOSIS — R972 Elevated prostate specific antigen [PSA]: Secondary | ICD-10-CM | POA: Diagnosis not present

## 2022-05-01 DIAGNOSIS — R600 Localized edema: Secondary | ICD-10-CM | POA: Diagnosis not present

## 2022-05-01 DIAGNOSIS — F331 Major depressive disorder, recurrent, moderate: Secondary | ICD-10-CM | POA: Diagnosis not present

## 2022-05-01 DIAGNOSIS — R42 Dizziness and giddiness: Secondary | ICD-10-CM | POA: Diagnosis not present

## 2022-05-01 DIAGNOSIS — I10 Essential (primary) hypertension: Secondary | ICD-10-CM | POA: Diagnosis not present

## 2022-05-01 DIAGNOSIS — Z0000 Encounter for general adult medical examination without abnormal findings: Secondary | ICD-10-CM | POA: Diagnosis not present

## 2022-05-01 DIAGNOSIS — E7800 Pure hypercholesterolemia, unspecified: Secondary | ICD-10-CM | POA: Diagnosis not present

## 2022-05-01 DIAGNOSIS — Z23 Encounter for immunization: Secondary | ICD-10-CM | POA: Diagnosis not present

## 2022-05-01 DIAGNOSIS — Z6836 Body mass index (BMI) 36.0-36.9, adult: Secondary | ICD-10-CM | POA: Diagnosis not present

## 2022-05-01 DIAGNOSIS — H547 Unspecified visual loss: Secondary | ICD-10-CM | POA: Diagnosis not present

## 2022-05-01 DIAGNOSIS — R262 Difficulty in walking, not elsewhere classified: Secondary | ICD-10-CM | POA: Diagnosis not present

## 2022-08-09 ENCOUNTER — Emergency Department: Payer: PPO

## 2022-08-09 ENCOUNTER — Other Ambulatory Visit: Payer: Self-pay

## 2022-08-09 ENCOUNTER — Emergency Department
Admission: EM | Admit: 2022-08-09 | Discharge: 2022-08-09 | Disposition: A | Discharge status: Home / Self Care | Payer: PPO | Attending: Emergency Medicine | Admitting: Emergency Medicine

## 2022-08-09 DIAGNOSIS — I509 Heart failure, unspecified: Secondary | ICD-10-CM | POA: Insufficient documentation

## 2022-08-09 DIAGNOSIS — E860 Dehydration: Secondary | ICD-10-CM | POA: Insufficient documentation

## 2022-08-09 DIAGNOSIS — R197 Diarrhea, unspecified: Secondary | ICD-10-CM | POA: Insufficient documentation

## 2022-08-09 DIAGNOSIS — Z20822 Contact with and (suspected) exposure to covid-19: Secondary | ICD-10-CM | POA: Insufficient documentation

## 2022-08-09 DIAGNOSIS — R109 Unspecified abdominal pain: Secondary | ICD-10-CM | POA: Diagnosis not present

## 2022-08-09 DIAGNOSIS — R0602 Shortness of breath: Secondary | ICD-10-CM | POA: Diagnosis not present

## 2022-08-09 DIAGNOSIS — J9811 Atelectasis: Secondary | ICD-10-CM | POA: Diagnosis not present

## 2022-08-09 DIAGNOSIS — R079 Chest pain, unspecified: Secondary | ICD-10-CM | POA: Diagnosis not present

## 2022-08-09 LAB — CBC WITH DIFFERENTIAL/PLATELET
Abs Immature Granulocytes: 0.02 10*3/uL (ref 0.00–0.07)
Basophils Absolute: 0 10*3/uL (ref 0.0–0.1)
Basophils Relative: 0 %
Eosinophils Absolute: 0.2 10*3/uL (ref 0.0–0.5)
Eosinophils Relative: 3 %
HCT: 48.5 % (ref 39.0–52.0)
Hemoglobin: 16.6 g/dL (ref 13.0–17.0)
Immature Granulocytes: 0 %
Lymphocytes Relative: 19 %
Lymphs Abs: 1.5 10*3/uL (ref 0.7–4.0)
MCH: 32.2 pg (ref 26.0–34.0)
MCHC: 34.2 g/dL (ref 30.0–36.0)
MCV: 94 fL (ref 80.0–100.0)
Monocytes Absolute: 0.5 10*3/uL (ref 0.1–1.0)
Monocytes Relative: 7 %
Neutro Abs: 5.3 10*3/uL (ref 1.7–7.7)
Neutrophils Relative %: 71 %
Platelets: 143 10*3/uL — ABNORMAL LOW (ref 150–400)
RBC: 5.16 MIL/uL (ref 4.22–5.81)
RDW: 13.2 % (ref 11.5–15.5)
WBC: 7.6 10*3/uL (ref 4.0–10.5)
nRBC: 0 % (ref 0.0–0.2)

## 2022-08-09 LAB — RESP PANEL BY RT-PCR (RSV, FLU A&B, COVID)  RVPGX2
Influenza A by PCR: NEGATIVE
Influenza B by PCR: NEGATIVE
Resp Syncytial Virus by PCR: NEGATIVE
SARS Coronavirus 2 by RT PCR: NEGATIVE

## 2022-08-09 LAB — URINALYSIS, ROUTINE W REFLEX MICROSCOPIC
Bilirubin Urine: NEGATIVE
Glucose, UA: NEGATIVE mg/dL
Hgb urine dipstick: NEGATIVE
Ketones, ur: 5 mg/dL — AB
Leukocytes,Ua: NEGATIVE
Nitrite: NEGATIVE
Protein, ur: 100 mg/dL — AB
Specific Gravity, Urine: 1.028 (ref 1.005–1.030)
pH: 5 (ref 5.0–8.0)

## 2022-08-09 LAB — TROPONIN I (HIGH SENSITIVITY): Troponin I (High Sensitivity): 11 ng/L (ref <18)

## 2022-08-09 LAB — COMPREHENSIVE METABOLIC PANEL
ALT: 79 U/L — ABNORMAL HIGH (ref 0–44)
AST: 68 U/L — ABNORMAL HIGH (ref 15–41)
Albumin: 4.4 g/dL (ref 3.5–5.0)
Alkaline Phosphatase: 52 U/L (ref 38–126)
Anion gap: 8 (ref 5–15)
BUN: 31 mg/dL — ABNORMAL HIGH (ref 8–23)
CO2: 23 mmol/L (ref 22–32)
Calcium: 8.3 mg/dL — ABNORMAL LOW (ref 8.9–10.3)
Chloride: 105 mmol/L (ref 98–111)
Creatinine, Ser: 1.61 mg/dL — ABNORMAL HIGH (ref 0.61–1.24)
GFR, Estimated: 40 mL/min — ABNORMAL LOW (ref >60)
Glucose, Bld: 116 mg/dL — ABNORMAL HIGH (ref 70–99)
Potassium: 3.6 mmol/L (ref 3.5–5.1)
Sodium: 136 mmol/L (ref 135–145)
Total Bilirubin: 1.7 mg/dL — ABNORMAL HIGH (ref 0.3–1.2)
Total Protein: 7.1 g/dL (ref 6.5–8.1)

## 2022-08-09 LAB — LIPASE, BLOOD: Lipase: 44 U/L (ref 11–51)

## 2022-08-09 LAB — CK: Total CK: 266 U/L (ref 49–397)

## 2022-08-09 LAB — BRAIN NATRIURETIC PEPTIDE: B Natriuretic Peptide: 14.8 pg/mL (ref 0.0–100.0)

## 2022-08-09 NOTE — ED Provider Notes (Signed)
[facility] Provider Note    Event Date/Time   First MD Initiated Contact with Patient 08/09/22 1702     (approximate)   History   Diarrhea (Patient presents with 3 days of diarrhea, abdominal pain, and decreased PO intake)   HPI  Jesse Meadows is a 89 y.o. male with CHF on intermittent Lasix 3 days a week he comes in with concerns for diarrhea.  Patient said 3 days of watery diarrhea he reports multiple episodes of it.  He states that he has not had any obvious new chest pain or shortness of breath that may be a little bit more than normal.  However this has been going on for some time now.  He states that he is not really having any abdominal pain at this time it is only when the diarrhea is coming on.  He states he has not been eating and drinking as much.  He denies any falls and hitting his head.  He does report some worsening swelling in his legs.  He reports chronic right leg greater than left leg swelling due to prior right hip surgery.  He has had ultrasounds previously that have not shown any blood clots.  He denies any pressure in his rectum or concerns for stool ball.  He denies using any laxatives.  Physical Exam   Triage Vital Signs: ED Triage Vitals  Enc Vitals Group     BP 08/09/22 1629 130/75     Pulse Rate 08/09/22 1629 76     Resp 08/09/22 1629 16     Temp 08/09/22 1629 97.7 F (36.5 C)     Temp Source 08/09/22 1629 Oral     SpO2 08/09/22 1629 92 %     Weight 08/09/22 1632 230 lb (104.3 kg)     Height 08/09/22 1632 6' (1.829 m)     Head Circumference --      Peak Flow --      Pain Score 08/09/22 1629 0     Pain Loc --      Pain Edu? --      Excl. in [place]? --     Most recent vital signs: Vitals:   08/09/22 1629  BP: 130/75  Pulse: 76  Resp: 16  Temp: 97.7 F (36.5 C)  SpO2: 92%     General: Awake, no distress.  CV:  Good peripheral perfusion.  Resp:  Normal effort.  Abd:  No distention.  Soft  nontender Other:  Bilateral edema noted.  May be slightly enlarged right leg compared to left but patient reports this is baseline   ED Results / Procedures / Treatments   Labs (all labs ordered are listed, but only abnormal results are displayed) Labs Reviewed  CBC WITH DIFFERENTIAL/PLATELET - Abnormal; Notable for the following components:      Result Value   Platelets 143 (*)    All other components within normal limits  COMPREHENSIVE METABOLIC PANEL - Abnormal; Notable for the following components:   Glucose, Bld 116 (*)    BUN 31 (*)    Creatinine, Ser 1.61 (*)    Calcium 8.3 (*)    AST 68 (*)    ALT 79 (*)    Total Bilirubin 1.7 (*)    GFR, Estimated 40 (*)    All other components within normal limits  RESP PANEL BY RT-PCR (RSV, FLU A&B, COVID)  RVPGX2  LIPASE, BLOOD  URINALYSIS, ROUTINE W REFLEX MICROSCOPIC     EKG  My interpretation of EKG:  Sinus rate of 82 without any ST elevation or T wave inversion, right bundle branch block and left anterior fascicular block.  RADIOLOGY I have reviewed the xray personally and interpreted some left basilar atelectasis  PROCEDURES:  Critical Care performed: No  Procedures   MEDICATIONS ORDERED IN ED: Medications - No data to display   IMPRESSION / MDM / [place] / ED COURSE  I reviewed the triage vital signs and the nursing notes.   Patient's presentation is most consistent with acute presentation with potential threat to life or bodily function.   Differential for diarrhea is dehydration, AKI, electrolyte abnormalities.  Currently abdomen soft nontender low suspicion for acute abdominal pathology.  No blood in stool suggest mesenteric ischemia.  Oxygen level was charted 90% I hooked him up in the room and he was 95 to 96%.  He reports some baseline shortness of breath has been going on for months now but given his edema in his legs I did add on a chest x-ray just to see there is any evidence of significant  CHF.  CK normal troponin negative BNP normal CBC shows stably low platelets.  CMP creatinine slightly elevated at 1.6 I found his baselines from [facility] are 1.3.  Liver tests are slightly elevated.  He is got no tenderness in the right upper quadrant I did review a new where he had a cholecystectomy done in 2019.  He denies any significant Tylenol use, significant alcohol use.  Suspect that this could be some hepatic congestion versus dehydration.  6:14 PM repeat abdominal exam remains soft and nontender  We discussed IV fluids but he is tolerating p.o. at this time.  Given only the slight abnormalities he feels comfortable with doing increase p.o. intake and hold his Lasix for 1-2 doses as his renal function is recovering I suspect this is from some slight dehydration.  We discussed IV fluids but of opted to hold off.  He has been in emergency room for over 2 hours, COVID and flu were negative.  Has not been able to give any stool studies here.  Suspect viral illness.  We discussed return precautions in regards  7:34 PM UA with 5 ketones and some protein noted in it.  Patient not significantly hypertensive suspect this is most likely related to some mild dehydration.  We again discussed IV fluids versus p.o. he prefer p.o.  He can follow-up with his primary care doctor for recheck of labs, urine in a few days.  At this time his abdomen remains soft and nontender and he feels comfortable with discharge home.  Has not been able to give a stool sample here but would be comfortable with being discharged at this time       FINAL CLINICAL IMPRESSION(S) / ED DIAGNOSES   Final diagnoses:  Diarrhea, unspecified type  Dehydration     Rx / DC Orders   ED Discharge Orders     None        Note:  This document was prepared using Dragon voice recognition software and may include unintentional dictation errors.   Vanessa [place], MD 08/09/22 Joen Laura

## 2022-08-09 NOTE — ED Triage Notes (Signed)
Patient presents with 3 days of diarrhea, abdominal pain, and decreased PO intake

## 2022-08-09 NOTE — ED Notes (Signed)
Pt verbalized understanding of DC instructions. Signing pad did not work.

## 2022-08-09 NOTE — ED Notes (Signed)
ED Provider at bedside. 

## 2022-08-09 NOTE — ED Notes (Signed)
First Nurse Note: Pt to ED via POV, pt niece states that she thinks patient may be dehydrated. Pt is in NAD at this time.

## 2022-08-09 NOTE — Discharge Instructions (Addendum)
Given your blood work I suspect that your slightly dehydrated will be talked about IV fluids and the risk of a building up in your lungs due to your swelling in your legs at this time we have opted to do p.o. hydration with things that are low in sugar.  Hold your Lasix on Monday and depending on how you feel on Wednesday you can also hold this dose.  If you are feeling short of breath or noticing your legs are increasing in size and may be taken on Wednesday.  Please call your primary care doctor to make a follow-up for next weeks he can have your kidney function and liver test rechecked but I suspect that they are just slightly elevated due to some mild dehydration.  Return to the ER if you develop abdominal pain that is continuing, fevers or any other concerns.  You also have some protein noted in your urine but this could also be from some dehydration so have them recheck this as well and if continues to have a significant amount of protein in it they may need to refer you to a kidney doctor.

## 2022-08-12 DIAGNOSIS — K625 Hemorrhage of anus and rectum: Secondary | ICD-10-CM | POA: Diagnosis not present

## 2022-08-12 DIAGNOSIS — R197 Diarrhea, unspecified: Secondary | ICD-10-CM | POA: Diagnosis not present

## 2022-08-17 DIAGNOSIS — R059 Cough, unspecified: Secondary | ICD-10-CM | POA: Diagnosis not present

## 2022-08-17 DIAGNOSIS — Z9181 History of falling: Secondary | ICD-10-CM | POA: Diagnosis not present

## 2022-08-17 DIAGNOSIS — G4700 Insomnia, unspecified: Secondary | ICD-10-CM | POA: Diagnosis not present

## 2022-08-17 DIAGNOSIS — M791 Myalgia, unspecified site: Secondary | ICD-10-CM | POA: Diagnosis not present

## 2022-08-17 DIAGNOSIS — E785 Hyperlipidemia, unspecified: Secondary | ICD-10-CM | POA: Diagnosis not present

## 2022-08-17 DIAGNOSIS — Z20822 Contact with and (suspected) exposure to covid-19: Secondary | ICD-10-CM | POA: Diagnosis not present

## 2022-08-17 DIAGNOSIS — I509 Heart failure, unspecified: Secondary | ICD-10-CM | POA: Diagnosis not present

## 2022-08-17 DIAGNOSIS — Z87891 Personal history of nicotine dependence: Secondary | ICD-10-CM | POA: Diagnosis not present

## 2022-08-17 DIAGNOSIS — R5383 Other fatigue: Secondary | ICD-10-CM | POA: Diagnosis not present

## 2022-08-17 DIAGNOSIS — R5381 Other malaise: Secondary | ICD-10-CM | POA: Diagnosis not present

## 2022-08-17 DIAGNOSIS — N182 Chronic kidney disease, stage 2 (mild): Secondary | ICD-10-CM | POA: Diagnosis not present

## 2022-08-17 DIAGNOSIS — J3489 Other specified disorders of nose and nasal sinuses: Secondary | ICD-10-CM | POA: Diagnosis not present

## 2022-08-17 DIAGNOSIS — J101 Influenza due to other identified influenza virus with other respiratory manifestations: Secondary | ICD-10-CM | POA: Diagnosis not present

## 2022-08-17 DIAGNOSIS — H3530 Unspecified macular degeneration: Secondary | ICD-10-CM | POA: Diagnosis not present

## 2022-08-17 DIAGNOSIS — R9431 Abnormal electrocardiogram [ECG] [EKG]: Secondary | ICD-10-CM | POA: Diagnosis not present

## 2022-08-17 DIAGNOSIS — H548 Legal blindness, as defined in USA: Secondary | ICD-10-CM | POA: Diagnosis not present

## 2022-08-17 DIAGNOSIS — J9811 Atelectasis: Secondary | ICD-10-CM | POA: Diagnosis not present

## 2022-08-17 DIAGNOSIS — R001 Bradycardia, unspecified: Secondary | ICD-10-CM | POA: Diagnosis not present

## 2022-08-17 DIAGNOSIS — R6883 Chills (without fever): Secondary | ICD-10-CM | POA: Diagnosis not present

## 2022-08-17 DIAGNOSIS — I130 Hypertensive heart and chronic kidney disease with heart failure and stage 1 through stage 4 chronic kidney disease, or unspecified chronic kidney disease: Secondary | ICD-10-CM | POA: Diagnosis not present

## 2022-08-18 DIAGNOSIS — R5381 Other malaise: Secondary | ICD-10-CM | POA: Diagnosis not present

## 2022-08-18 DIAGNOSIS — N182 Chronic kidney disease, stage 2 (mild): Secondary | ICD-10-CM | POA: Diagnosis not present

## 2022-08-18 DIAGNOSIS — I5041 Acute combined systolic (congestive) and diastolic (congestive) heart failure: Secondary | ICD-10-CM | POA: Diagnosis not present

## 2022-08-18 DIAGNOSIS — R001 Bradycardia, unspecified: Secondary | ICD-10-CM | POA: Diagnosis not present

## 2022-08-24 DIAGNOSIS — G4733 Obstructive sleep apnea (adult) (pediatric): Secondary | ICD-10-CM | POA: Diagnosis not present

## 2022-08-24 DIAGNOSIS — E7800 Pure hypercholesterolemia, unspecified: Secondary | ICD-10-CM | POA: Diagnosis not present

## 2022-08-24 DIAGNOSIS — R001 Bradycardia, unspecified: Secondary | ICD-10-CM | POA: Diagnosis not present

## 2022-08-24 DIAGNOSIS — R0602 Shortness of breath: Secondary | ICD-10-CM | POA: Diagnosis not present

## 2022-08-24 DIAGNOSIS — I10 Essential (primary) hypertension: Secondary | ICD-10-CM | POA: Diagnosis not present

## 2022-09-08 DIAGNOSIS — R0602 Shortness of breath: Secondary | ICD-10-CM | POA: Diagnosis not present

## 2022-09-16 DIAGNOSIS — R001 Bradycardia, unspecified: Secondary | ICD-10-CM | POA: Diagnosis not present

## 2022-09-17 DIAGNOSIS — R001 Bradycardia, unspecified: Secondary | ICD-10-CM | POA: Diagnosis not present

## 2022-09-21 DIAGNOSIS — R0602 Shortness of breath: Secondary | ICD-10-CM | POA: Diagnosis not present

## 2022-09-21 DIAGNOSIS — R001 Bradycardia, unspecified: Secondary | ICD-10-CM | POA: Diagnosis not present

## 2022-09-21 DIAGNOSIS — G4733 Obstructive sleep apnea (adult) (pediatric): Secondary | ICD-10-CM | POA: Diagnosis not present

## 2022-09-21 DIAGNOSIS — E7800 Pure hypercholesterolemia, unspecified: Secondary | ICD-10-CM | POA: Diagnosis not present

## 2022-09-21 DIAGNOSIS — I10 Essential (primary) hypertension: Secondary | ICD-10-CM | POA: Diagnosis not present

## 2022-09-24 DIAGNOSIS — D044 Carcinoma in situ of skin of scalp and neck: Secondary | ICD-10-CM | POA: Diagnosis not present

## 2022-09-24 DIAGNOSIS — L821 Other seborrheic keratosis: Secondary | ICD-10-CM | POA: Diagnosis not present

## 2022-09-24 DIAGNOSIS — D485 Neoplasm of uncertain behavior of skin: Secondary | ICD-10-CM | POA: Diagnosis not present

## 2022-09-24 DIAGNOSIS — L570 Actinic keratosis: Secondary | ICD-10-CM | POA: Diagnosis not present

## 2022-10-22 DIAGNOSIS — D044 Carcinoma in situ of skin of scalp and neck: Secondary | ICD-10-CM | POA: Diagnosis not present

## 2022-10-26 DIAGNOSIS — R42 Dizziness and giddiness: Secondary | ICD-10-CM | POA: Diagnosis not present

## 2022-10-26 DIAGNOSIS — H547 Unspecified visual loss: Secondary | ICD-10-CM | POA: Diagnosis not present

## 2022-10-26 DIAGNOSIS — R262 Difficulty in walking, not elsewhere classified: Secondary | ICD-10-CM | POA: Diagnosis not present

## 2022-10-26 DIAGNOSIS — F331 Major depressive disorder, recurrent, moderate: Secondary | ICD-10-CM | POA: Diagnosis not present

## 2022-10-26 DIAGNOSIS — E7800 Pure hypercholesterolemia, unspecified: Secondary | ICD-10-CM | POA: Diagnosis not present

## 2022-10-26 DIAGNOSIS — I10 Essential (primary) hypertension: Secondary | ICD-10-CM | POA: Diagnosis not present

## 2022-10-26 DIAGNOSIS — R600 Localized edema: Secondary | ICD-10-CM | POA: Diagnosis not present

## 2022-11-02 DIAGNOSIS — Z96641 Presence of right artificial hip joint: Secondary | ICD-10-CM | POA: Diagnosis not present

## 2022-11-02 DIAGNOSIS — N1831 Chronic kidney disease, stage 3a: Secondary | ICD-10-CM | POA: Diagnosis not present

## 2022-11-02 DIAGNOSIS — M5442 Lumbago with sciatica, left side: Secondary | ICD-10-CM | POA: Diagnosis not present

## 2022-11-02 DIAGNOSIS — H548 Legal blindness, as defined in USA: Secondary | ICD-10-CM | POA: Diagnosis not present

## 2022-11-02 DIAGNOSIS — M4126 Other idiopathic scoliosis, lumbar region: Secondary | ICD-10-CM | POA: Diagnosis not present

## 2022-11-02 DIAGNOSIS — G8929 Other chronic pain: Secondary | ICD-10-CM | POA: Diagnosis not present

## 2022-11-02 DIAGNOSIS — Z0000 Encounter for general adult medical examination without abnormal findings: Secondary | ICD-10-CM | POA: Diagnosis not present

## 2022-11-02 DIAGNOSIS — Z9181 History of falling: Secondary | ICD-10-CM | POA: Diagnosis not present

## 2022-11-02 DIAGNOSIS — M5441 Lumbago with sciatica, right side: Secondary | ICD-10-CM | POA: Diagnosis not present

## 2022-11-02 DIAGNOSIS — Z1331 Encounter for screening for depression: Secondary | ICD-10-CM | POA: Diagnosis not present

## 2022-11-02 DIAGNOSIS — F3341 Major depressive disorder, recurrent, in partial remission: Secondary | ICD-10-CM | POA: Diagnosis not present

## 2022-11-02 DIAGNOSIS — D696 Thrombocytopenia, unspecified: Secondary | ICD-10-CM | POA: Diagnosis not present

## 2022-11-02 DIAGNOSIS — Z6837 Body mass index (BMI) 37.0-37.9, adult: Secondary | ICD-10-CM | POA: Diagnosis not present

## 2022-11-02 DIAGNOSIS — I10 Essential (primary) hypertension: Secondary | ICD-10-CM | POA: Diagnosis not present

## 2022-12-22 DIAGNOSIS — E7800 Pure hypercholesterolemia, unspecified: Secondary | ICD-10-CM | POA: Diagnosis not present

## 2022-12-22 DIAGNOSIS — G4733 Obstructive sleep apnea (adult) (pediatric): Secondary | ICD-10-CM | POA: Diagnosis not present

## 2022-12-22 DIAGNOSIS — I10 Essential (primary) hypertension: Secondary | ICD-10-CM | POA: Diagnosis not present

## 2022-12-22 DIAGNOSIS — M7989 Other specified soft tissue disorders: Secondary | ICD-10-CM | POA: Diagnosis not present

## 2022-12-22 DIAGNOSIS — R001 Bradycardia, unspecified: Secondary | ICD-10-CM | POA: Diagnosis not present

## 2022-12-23 DIAGNOSIS — M7989 Other specified soft tissue disorders: Secondary | ICD-10-CM | POA: Diagnosis not present

## 2022-12-23 DIAGNOSIS — I10 Essential (primary) hypertension: Secondary | ICD-10-CM | POA: Diagnosis not present

## 2023-04-08 DIAGNOSIS — H401131 Primary open-angle glaucoma, bilateral, mild stage: Secondary | ICD-10-CM | POA: Diagnosis not present

## 2023-04-08 DIAGNOSIS — H353134 Nonexudative age-related macular degeneration, bilateral, advanced atrophic with subfoveal involvement: Secondary | ICD-10-CM | POA: Diagnosis not present

## 2023-04-08 DIAGNOSIS — Z961 Presence of intraocular lens: Secondary | ICD-10-CM | POA: Diagnosis not present

## 2023-04-08 DIAGNOSIS — H43813 Vitreous degeneration, bilateral: Secondary | ICD-10-CM | POA: Diagnosis not present

## 2023-04-12 DIAGNOSIS — E7800 Pure hypercholesterolemia, unspecified: Secondary | ICD-10-CM | POA: Diagnosis not present

## 2023-04-12 DIAGNOSIS — Z23 Encounter for immunization: Secondary | ICD-10-CM | POA: Diagnosis not present

## 2023-04-12 DIAGNOSIS — I10 Essential (primary) hypertension: Secondary | ICD-10-CM | POA: Diagnosis not present

## 2023-04-12 DIAGNOSIS — G4733 Obstructive sleep apnea (adult) (pediatric): Secondary | ICD-10-CM | POA: Diagnosis not present

## 2023-04-12 DIAGNOSIS — R001 Bradycardia, unspecified: Secondary | ICD-10-CM | POA: Diagnosis not present

## 2023-04-12 DIAGNOSIS — R0602 Shortness of breath: Secondary | ICD-10-CM | POA: Diagnosis not present

## 2023-05-27 DIAGNOSIS — L821 Other seborrheic keratosis: Secondary | ICD-10-CM | POA: Diagnosis not present

## 2023-05-27 DIAGNOSIS — D2261 Melanocytic nevi of right upper limb, including shoulder: Secondary | ICD-10-CM | POA: Diagnosis not present

## 2023-05-27 DIAGNOSIS — L570 Actinic keratosis: Secondary | ICD-10-CM | POA: Diagnosis not present

## 2023-05-27 DIAGNOSIS — Z872 Personal history of diseases of the skin and subcutaneous tissue: Secondary | ICD-10-CM | POA: Diagnosis not present

## 2023-05-27 DIAGNOSIS — D2262 Melanocytic nevi of left upper limb, including shoulder: Secondary | ICD-10-CM | POA: Diagnosis not present

## 2023-05-27 DIAGNOSIS — D225 Melanocytic nevi of trunk: Secondary | ICD-10-CM | POA: Diagnosis not present

## 2023-05-27 DIAGNOSIS — Z85828 Personal history of other malignant neoplasm of skin: Secondary | ICD-10-CM | POA: Diagnosis not present

## 2023-05-27 DIAGNOSIS — Z08 Encounter for follow-up examination after completed treatment for malignant neoplasm: Secondary | ICD-10-CM | POA: Diagnosis not present

## 2023-06-15 DIAGNOSIS — Z0000 Encounter for general adult medical examination without abnormal findings: Secondary | ICD-10-CM | POA: Diagnosis not present

## 2023-06-15 DIAGNOSIS — Z125 Encounter for screening for malignant neoplasm of prostate: Secondary | ICD-10-CM | POA: Diagnosis not present

## 2023-06-15 DIAGNOSIS — I10 Essential (primary) hypertension: Secondary | ICD-10-CM | POA: Diagnosis not present

## 2023-06-22 DIAGNOSIS — D696 Thrombocytopenia, unspecified: Secondary | ICD-10-CM | POA: Diagnosis not present

## 2023-06-22 DIAGNOSIS — Z6838 Body mass index (BMI) 38.0-38.9, adult: Secondary | ICD-10-CM | POA: Diagnosis not present

## 2023-06-22 DIAGNOSIS — R262 Difficulty in walking, not elsewhere classified: Secondary | ICD-10-CM | POA: Diagnosis not present

## 2023-06-22 DIAGNOSIS — R972 Elevated prostate specific antigen [PSA]: Secondary | ICD-10-CM | POA: Diagnosis not present

## 2023-06-22 DIAGNOSIS — I890 Lymphedema, not elsewhere classified: Secondary | ICD-10-CM | POA: Diagnosis not present

## 2023-06-22 DIAGNOSIS — F331 Major depressive disorder, recurrent, moderate: Secondary | ICD-10-CM | POA: Diagnosis not present

## 2023-06-22 DIAGNOSIS — F339 Major depressive disorder, recurrent, unspecified: Secondary | ICD-10-CM | POA: Diagnosis not present

## 2023-06-22 DIAGNOSIS — N1831 Chronic kidney disease, stage 3a: Secondary | ICD-10-CM | POA: Diagnosis not present

## 2023-06-22 DIAGNOSIS — E7800 Pure hypercholesterolemia, unspecified: Secondary | ICD-10-CM | POA: Diagnosis not present

## 2023-06-22 DIAGNOSIS — I10 Essential (primary) hypertension: Secondary | ICD-10-CM | POA: Diagnosis not present

## 2023-07-22 ENCOUNTER — Ambulatory Visit (INDEPENDENT_AMBULATORY_CARE_PROVIDER_SITE_OTHER): Payer: PPO | Admitting: Nurse Practitioner

## 2023-07-22 ENCOUNTER — Encounter (INDEPENDENT_AMBULATORY_CARE_PROVIDER_SITE_OTHER): Payer: Self-pay | Admitting: Nurse Practitioner

## 2023-07-22 VITALS — BP 123/63 | HR 71 | Resp 15 | Wt 246.0 lb

## 2023-07-22 DIAGNOSIS — E785 Hyperlipidemia, unspecified: Secondary | ICD-10-CM | POA: Diagnosis not present

## 2023-07-22 DIAGNOSIS — I890 Lymphedema, not elsewhere classified: Secondary | ICD-10-CM

## 2023-07-22 DIAGNOSIS — I10 Essential (primary) hypertension: Secondary | ICD-10-CM | POA: Diagnosis not present

## 2023-07-25 ENCOUNTER — Encounter (INDEPENDENT_AMBULATORY_CARE_PROVIDER_SITE_OTHER): Payer: Self-pay | Admitting: Nurse Practitioner

## 2023-07-25 NOTE — Progress Notes (Signed)
 Subjective:    Patient ID: Jesse Meadows, male    DOB: [birth year], 89 y.o.   MRN: [id number] Chief Complaint  Patient presents with   New Patient (Initial Visit)    Ref hande consult lymphedema    Patient is seen for evaluation of leg swelling. The patient first noticed the swelling remotely but is now concerned because of a significant increase in the overall edema. The swelling isn't associated with significant pain.  There has been an increasing amount of  discoloration noted by the patient. The patient notes that in the morning the legs are improved but they steadily worsened throughout the course of the day. Elevation seems to make the swelling of the legs better, dependency makes them much worse.   There is no history of ulcerations associated with the swelling.   The patient denies any recent changes in their medications.  The patient has not been wearing graduated compression.  The patient has no had any past angiography, interventions or vascular surgery.  The patient denies a history of DVT or PE. There is no prior history of phlebitis. There is no history of primary lymphedema.  There is no history of radiation treatment to the groin or pelvis No history of malignancies. No history of trauma or groin or pelvic surgery. No history of foreign travel or parasitic infections area    The patient has worn medical grade compression socks but these have not been successful with his recent worsening swelling.  He has done medical grade compression and elevation and activity for at least 4 weeks with no improvement.     Review of Systems  Cardiovascular:  Positive for leg swelling.  Skin:  Negative for wound.  All other systems reviewed and are negative.      Objective:   Physical Exam Vitals reviewed.  HENT:     Head: Normocephalic.  Cardiovascular:     Rate and Rhythm: Normal rate.  Pulmonary:     Effort: Pulmonary effort is normal.  Musculoskeletal:     Right  lower leg: 3+ Edema present.     Left lower leg: 3+ Edema present.  Skin:    General: Skin is warm and dry.     Comments: Hyperpigmentation  Neurological:     Mental Status: He is alert and oriented to person, place, and time.  Psychiatric:        Mood and Affect: Mood normal.        Behavior: Behavior normal.        Thought Content: Thought content normal.        Judgment: Judgment normal.     BP 123/63   Pulse 71   Resp 15   Wt 246 lb (111.6 kg)   BMI 33.36 kg/m   Past Medical History:  Diagnosis Date   Cancer (HCC)    skin cancer on the nose   Chronic kidney disease    RENAL INSUFF   Complication of anesthesia    could not get bowels to move after back surgery hospitalized 9 days   GERD (gastroesophageal reflux disease)    HLD (hyperlipidemia)    Hypertension    Macular degeneration    Pneumonia    Sleep apnea     Social History   Socioeconomic History   Marital status: Widowed    Spouse name: Not on file   Number of children: Not on file   Years of education: Not on file   Highest education level: Not on file  Occupational History   Not on file  Tobacco Use   Smoking status: Former   Smokeless tobacco: Former  Building Services Engineer status: Never Used  Substance and Sexual Activity   Alcohol  use: No   Drug use: No   Sexual activity: Not on file  Other Topics Concern   Not on file  Social History Narrative   Not on file   Social Drivers of Health   Financial Resource Strain: Low Risk  (06/22/2023)   Received from [facility]   Overall Financial Resource Strain (CARDIA)    Difficulty of Paying Living Expenses: Not hard at all  Food Insecurity: No Food Insecurity (06/22/2023)   Received from [facility]   Hunger Vital Sign    Worried About Running Out of Food in the Last Year: Never true    Ran Out of Food in the Last Year: Never true  Transportation Needs: No Transportation Needs (06/22/2023)   Received from [facility] - Transportation    In the past 12 months, has lack of transportation kept you from medical appointments or from getting medications?: No    Lack of Transportation (Non-Medical): No  Physical Activity: Not on file  Stress: Not on file  Social Connections: Not on file  Intimate Partner Violence: Not on file    Past Surgical History:  Procedure Laterality Date   BACK SURGERY     CATARACT EXTRACTION, BILATERAL     CHOLECYSTECTOMY N/A 09/14/2017   Procedure: LAPAROSCOPIC CHOLECYSTECTOMY;  Surgeon: Desiderio Schanz, MD;  Location: [facility] ORS;  Service: General;  Laterality: N/A;   INGUINAL HERNIA REPAIR Left 01/24/2016   Procedure: HERNIA REPAIR INGUINAL ADULT;  Surgeon: Larinda Unknown Sharps, MD;  Location: [facility] ORS;  Service: General;  Laterality: Left;   TOTAL HIP ARTHROPLASTY Right 12/29/2016   Procedure: TOTAL HIP ARTHROPLASTY ANTERIOR APPROACH;  Surgeon: Kathlynn Sharper, MD;  Location: [facility] ORS;  Service: Orthopedics;  Laterality: Right;   UVULOPALATOPHARYNGOPLASTY      Family History  Problem Relation Age of Onset   Cancer Mother    CAD Mother    ALS Father     Allergies  Allergen Reactions   Beta Adrenergic Blockers Other (See Comments)    Bradycardia. Bradycardia.   Penicillins Rash and Other (See Comments)    Has patient had a PCN reaction causing immediate rash, facial/tongue/throat swelling, SOB or lightheadedness with hypotension: No Has patient had a PCN reaction causing severe rash involving mucus membranes or skin necrosis: No Has patient had a PCN reaction that required hospitalization: No Has patient had a PCN reaction occurring within the last 10 years: No If all of the above answers are NO, then may proceed with Cephalosporin use.        Latest Ref Rng & Units 08/09/2022    4:34 PM 09/15/2017    4:34 AM 09/14/2017    6:12 AM  CBC  WBC 4.0 - 10.5 K/uL 7.6  12.3  12.9   Hemoglobin 13.0 - 17.0 g/dL 83.3  85.1  83.7   Hematocrit 39.0 -  52.0 % 48.5  44.6  48.6   Platelets 150 - 400 K/uL 143  137  164       CMP     Component Value Date/Time   NA 136 08/09/2022 1634   K 3.6 08/09/2022 1634   CL 105 08/09/2022 1634   CO2 23 08/09/2022 1634   GLUCOSE 116 (H) 08/09/2022 1634  BUN 31 (H) 08/09/2022 1634   CREATININE 1.61 (H) 08/09/2022 1634   CALCIUM 8.3 (L) 08/09/2022 1634   PROT 7.1 08/09/2022 1634   ALBUMIN 4.4 08/09/2022 1634   AST 68 (H) 08/09/2022 1634   ALT 79 (H) 08/09/2022 1634   ALKPHOS 52 08/09/2022 1634   BILITOT 1.7 (H) 08/09/2022 1634   GFRNONAA 40 (L) 08/09/2022 1634     No results found.     Assessment & Plan:   1. Lymphedema (Primary) Recommend:  No surgery or intervention at this point in time.   The Patient is CEAP [id number].  The patient has been wearing compression for more than 12 weeks with no or little benefit.  The patient has been exercising daily for more than 12 weeks. The patient has been elevating and taking OTC pain medications for more than 12 weeks.  None of these have have eliminated the pain related to the lymphedema or the discomfort regarding excessive swelling and venous congestion.    I have reviewed my discussion with the patient regarding lymphedema and why it  causes symptoms.  Patient will continue wearing graduated compression on a daily basis. The patient should put the compression on first thing in the morning and removing them in the evening. The patient should not sleep in the compression.   In addition, behavioral modification throughout the day will be continued.  This will include frequent elevation (such as in a recliner), use of over the counter pain medications as needed and exercise such as walking.  The systemic causes for chronic edema such as liver, kidney and cardiac etiologies do not appear to have significant changed over the past year.    The patient has chronic , severe lymphedema with hyperpigmentation of the skin and has done MLD, skin care,  medication, diet, exercise, elevation and compression for 4 weeks with no improvement,  I am recommending a lymphedema pump.  The patient still has stage 3 lymphedema and therefore, I believe that a lymph pump is needed to improve the control of the patient's lymphedema and improve the quality of life.  Additionally, a lymph pump is warranted because it will reduce the risk of cellulitis and ulceration in the future.  Patient should follow-up in six months   2. Essential hypertension Continue antihypertensive medications as already ordered, these medications have been reviewed and there are no changes at this time.  3. Hyperlipidemia, unspecified hyperlipidemia type Continue statin as ordered and reviewed, no changes at this time   Current Outpatient Medications on File Prior to Visit  Medication Sig Dispense Refill   acetaminophen  (TYLENOL ) 650 MG CR tablet Take 1,300 mg by mouth at bedtime.     amLODipine  (NORVASC ) 5 MG tablet Take 1 tablet (5 mg total) by mouth daily. 30 tablet 2   aspirin EC 81 MG tablet Take by mouth.     budesonide -formoterol  (SYMBICORT ) 80-4.5 MCG/ACT inhaler Inhale 2 puffs into the lungs 2 (two) times daily. 1 Inhaler 0   docusate sodium  (COLACE) 100 MG capsule Take 1 capsule (100 mg total) by mouth 2 (two) times daily. (Patient taking differently: Take 100 mg by mouth at bedtime.) 10 capsule 0   furosemide  (LASIX ) 20 MG tablet Take 20 mg by mouth daily.     gabapentin  (NEURONTIN ) 600 MG tablet Take 600 mg by mouth at bedtime.     latanoprost  (XALATAN ) 0.005 % ophthalmic solution Place 1 drop into both eyes at bedtime.     lisinopril  (PRINIVIL ,ZESTRIL ) 40 MG tablet Take  40 mg by mouth.     losartan  (COZAAR ) 100 MG tablet Take 100 mg by mouth daily.     Multiple Vitamins-Minerals (PRESERVISION AREDS) CAPS Take 1 capsule by mouth daily.      oxyCODONE  (OXY IR/ROXICODONE ) 5 MG immediate release tablet Take 1 tablet (5 mg total) by mouth every 4 (four) hours as needed for  moderate pain or severe pain. 30 tablet 0   simvastatin  (ZOCOR ) 20 MG tablet Take 20 mg by mouth every evening.      vitamin B-12 (CYANOCOBALAMIN ) 1000 MCG tablet Take 1,000 mcg by mouth daily.     No current facility-administered medications on file prior to visit.    There are no Patient Instructions on file for this visit. No follow-ups on file.   Ranald Alessio E Tyrianna Lightle, NP

## 2023-10-11 DIAGNOSIS — I10 Essential (primary) hypertension: Secondary | ICD-10-CM | POA: Diagnosis not present

## 2023-10-11 DIAGNOSIS — G4733 Obstructive sleep apnea (adult) (pediatric): Secondary | ICD-10-CM | POA: Diagnosis not present

## 2023-10-11 DIAGNOSIS — R0602 Shortness of breath: Secondary | ICD-10-CM | POA: Diagnosis not present

## 2023-10-11 DIAGNOSIS — E7800 Pure hypercholesterolemia, unspecified: Secondary | ICD-10-CM | POA: Diagnosis not present

## 2023-10-11 DIAGNOSIS — R001 Bradycardia, unspecified: Secondary | ICD-10-CM | POA: Diagnosis not present

## 2023-10-20 ENCOUNTER — Telehealth (INDEPENDENT_AMBULATORY_CARE_PROVIDER_SITE_OTHER): Payer: Self-pay

## 2023-10-20 NOTE — Telephone Encounter (Signed)
 Pt called about the lymph pump that was ordered in February that he has not received I gave him the number to [employer] and he stated understanding

## 2023-11-17 DIAGNOSIS — I890 Lymphedema, not elsewhere classified: Secondary | ICD-10-CM | POA: Diagnosis not present

## 2023-12-15 DIAGNOSIS — F339 Major depressive disorder, recurrent, unspecified: Secondary | ICD-10-CM | POA: Diagnosis not present

## 2023-12-15 DIAGNOSIS — Z6838 Body mass index (BMI) 38.0-38.9, adult: Secondary | ICD-10-CM | POA: Diagnosis not present

## 2023-12-15 DIAGNOSIS — D696 Thrombocytopenia, unspecified: Secondary | ICD-10-CM | POA: Diagnosis not present

## 2023-12-15 DIAGNOSIS — F331 Major depressive disorder, recurrent, moderate: Secondary | ICD-10-CM | POA: Diagnosis not present

## 2023-12-15 DIAGNOSIS — R7309 Other abnormal glucose: Secondary | ICD-10-CM | POA: Diagnosis not present

## 2023-12-15 DIAGNOSIS — E7800 Pure hypercholesterolemia, unspecified: Secondary | ICD-10-CM | POA: Diagnosis not present

## 2023-12-15 DIAGNOSIS — I890 Lymphedema, not elsewhere classified: Secondary | ICD-10-CM | POA: Diagnosis not present

## 2023-12-15 DIAGNOSIS — R972 Elevated prostate specific antigen [PSA]: Secondary | ICD-10-CM | POA: Diagnosis not present

## 2023-12-15 DIAGNOSIS — N1831 Chronic kidney disease, stage 3a: Secondary | ICD-10-CM | POA: Diagnosis not present

## 2023-12-15 DIAGNOSIS — I10 Essential (primary) hypertension: Secondary | ICD-10-CM | POA: Diagnosis not present

## 2023-12-15 DIAGNOSIS — R262 Difficulty in walking, not elsewhere classified: Secondary | ICD-10-CM | POA: Diagnosis not present

## 2023-12-16 DIAGNOSIS — R600 Localized edema: Secondary | ICD-10-CM | POA: Diagnosis not present

## 2023-12-16 DIAGNOSIS — L570 Actinic keratosis: Secondary | ICD-10-CM | POA: Diagnosis not present

## 2023-12-16 DIAGNOSIS — D1801 Hemangioma of skin and subcutaneous tissue: Secondary | ICD-10-CM | POA: Diagnosis not present

## 2023-12-16 DIAGNOSIS — L821 Other seborrheic keratosis: Secondary | ICD-10-CM | POA: Diagnosis not present

## 2023-12-16 DIAGNOSIS — Z85828 Personal history of other malignant neoplasm of skin: Secondary | ICD-10-CM | POA: Diagnosis not present

## 2023-12-22 DIAGNOSIS — R262 Difficulty in walking, not elsewhere classified: Secondary | ICD-10-CM | POA: Diagnosis not present

## 2023-12-22 DIAGNOSIS — N1832 Chronic kidney disease, stage 3b: Secondary | ICD-10-CM | POA: Diagnosis not present

## 2023-12-22 DIAGNOSIS — H548 Legal blindness, as defined in USA: Secondary | ICD-10-CM | POA: Diagnosis not present

## 2023-12-22 DIAGNOSIS — F325 Major depressive disorder, single episode, in full remission: Secondary | ICD-10-CM | POA: Diagnosis not present

## 2023-12-22 DIAGNOSIS — R7309 Other abnormal glucose: Secondary | ICD-10-CM | POA: Diagnosis not present

## 2023-12-22 DIAGNOSIS — Z0000 Encounter for general adult medical examination without abnormal findings: Secondary | ICD-10-CM | POA: Diagnosis not present

## 2023-12-22 DIAGNOSIS — D696 Thrombocytopenia, unspecified: Secondary | ICD-10-CM | POA: Diagnosis not present

## 2023-12-22 DIAGNOSIS — Z6833 Body mass index (BMI) 33.0-33.9, adult: Secondary | ICD-10-CM | POA: Diagnosis not present

## 2023-12-22 DIAGNOSIS — Z1331 Encounter for screening for depression: Secondary | ICD-10-CM | POA: Diagnosis not present

## 2023-12-22 DIAGNOSIS — R972 Elevated prostate specific antigen [PSA]: Secondary | ICD-10-CM | POA: Diagnosis not present

## 2023-12-22 DIAGNOSIS — I10 Essential (primary) hypertension: Secondary | ICD-10-CM | POA: Diagnosis not present

## 2023-12-22 DIAGNOSIS — I890 Lymphedema, not elsewhere classified: Secondary | ICD-10-CM | POA: Diagnosis not present

## 2024-01-19 ENCOUNTER — Ambulatory Visit (INDEPENDENT_AMBULATORY_CARE_PROVIDER_SITE_OTHER): Payer: PPO | Admitting: Nurse Practitioner

## 2024-01-19 ENCOUNTER — Encounter (INDEPENDENT_AMBULATORY_CARE_PROVIDER_SITE_OTHER): Payer: Self-pay | Admitting: Nurse Practitioner

## 2024-01-19 VITALS — BP 128/76 | HR 73 | Resp 16 | Wt 245.0 lb

## 2024-01-19 DIAGNOSIS — H93A2 Pulsatile tinnitus, left ear: Secondary | ICD-10-CM

## 2024-01-19 DIAGNOSIS — I10 Essential (primary) hypertension: Secondary | ICD-10-CM

## 2024-01-19 DIAGNOSIS — I890 Lymphedema, not elsewhere classified: Secondary | ICD-10-CM

## 2024-01-19 NOTE — Progress Notes (Signed)
 Subjective:    Patient ID: Jesse Meadows, male    DOB: 07-23-1926, 89 y.o.   MRN: [id number] Chief Complaint  Patient presents with   Follow-up    6 month follow up    The patient returns to the office for followup evaluation regarding leg swelling.  The swelling has persisted but with the lymph pump is under much, much better controlled. The pain associated with swelling is decreased. There have not been any interval development of a ulcerations or wounds.  The patient denies problems with the pump, noting it is working well and the leggings are in good condition.  Since the previous visit the patient has been wearing graduated compression stockings and using the lymph pump on a routine basis and  has noted significant improvement in the lymphedema.   Patient stated the lymph pump has been helpful with the treatment of the lymphedema.  He also notes a sensation of hearing his heartbeat in his left ear when he lays on that side from time to time.  It happens several days per week.  He denies any amaurosis fugax or TIA or CVA-like symptoms.    Review of Systems  HENT:  Positive for tinnitus.   Cardiovascular:  Positive for leg swelling.  Neurological:  Positive for weakness.  All other systems reviewed and are negative.      Objective:   Physical Exam  BP 128/76   Pulse 73   Resp 16   Wt 245 lb (111.1 kg)   BMI 33.23 kg/m   Past Medical History:  Diagnosis Date   Cancer (HCC)    skin cancer on the nose   Chronic kidney disease    RENAL INSUFF   Complication of anesthesia    could not get bowels to move after back surgery hospitalized 9 days   GERD (gastroesophageal reflux disease)    HLD (hyperlipidemia)    Hypertension    Macular degeneration    Pneumonia    Sleep apnea     Social History   Socioeconomic History   Marital status: Widowed    Spouse name: Not on file   Number of children: Not on file   Years of education: Not on file   Highest education  level: Not on file  Occupational History   Not on file  Tobacco Use   Smoking status: Former   Smokeless tobacco: Former  Advertising account planner   Vaping status: Never Used  Substance and Sexual Activity   Alcohol  use: No   Drug use: No   Sexual activity: Not on file  Other Topics Concern   Not on file  Social History Narrative   Not on file   Social Drivers of Health   Financial Resource Strain: Low Risk  (06/22/2023)   Received from [facility]   Overall Financial Resource Strain (CARDIA)    Difficulty of Paying Living Expenses: Not hard at all  Food Insecurity: No Food Insecurity (06/22/2023)   Received from [facility]   Hunger Vital Sign    Within the past 12 months, you worried that your food would run out before you got the money to buy more.: Never true    Within the past 12 months, the food you bought just didn't last and you didn't have money to get more.: Never true  Transportation Needs: No Transportation Needs (06/22/2023)   Received from [facility] - Transportation    In the past  12 months, has lack of transportation kept you from medical appointments or from getting medications?: No    Lack of Transportation (Non-Medical): No  Physical Activity: Not on file  Stress: Not on file  Social Connections: Not on file  Intimate Partner Violence: Not on file    Past Surgical History:  Procedure Laterality Date   BACK SURGERY     CATARACT EXTRACTION, BILATERAL     CHOLECYSTECTOMY N/A 09/14/2017   Procedure: LAPAROSCOPIC CHOLECYSTECTOMY;  Surgeon: Desiderio Schanz, MD;  Location: [facility] ORS;  Service: General;  Laterality: N/A;   INGUINAL HERNIA REPAIR Left 01/24/2016   Procedure: HERNIA REPAIR INGUINAL ADULT;  Surgeon: Larinda Unknown Sharps, MD;  Location: [facility] ORS;  Service: General;  Laterality: Left;   TOTAL HIP ARTHROPLASTY Right 12/29/2016   Procedure: TOTAL HIP ARTHROPLASTY ANTERIOR APPROACH;  Surgeon: Kathlynn Sharper, MD;   Location: [facility] ORS;  Service: Orthopedics;  Laterality: Right;   UVULOPALATOPHARYNGOPLASTY      Family History  Problem Relation Age of Onset   Cancer Mother    CAD Mother    ALS Father     Allergies  Allergen Reactions   Beta Adrenergic Blockers Other (See Comments)    Bradycardia. Bradycardia.   Penicillins Rash and Other (See Comments)    Has patient had a PCN reaction causing immediate rash, facial/tongue/throat swelling, SOB or lightheadedness with hypotension: No Has patient had a PCN reaction causing severe rash involving mucus membranes or skin necrosis: No Has patient had a PCN reaction that required hospitalization: No Has patient had a PCN reaction occurring within the last 10 years: No If all of the above answers are NO, then may proceed with Cephalosporin use.        Latest Ref Rng & Units 08/09/2022    4:34 PM 09/15/2017    4:34 AM 09/14/2017    6:12 AM  CBC  WBC 4.0 - 10.5 K/uL 7.6  12.3  12.9   Hemoglobin 13.0 - 17.0 g/dL 83.3  85.1  83.7   Hematocrit 39.0 - 52.0 % 48.5  44.6  48.6   Platelets 150 - 400 K/uL 143  137  164       CMP     Component Value Date/Time   NA 136 08/09/2022 1634   K 3.6 08/09/2022 1634   CL 105 08/09/2022 1634   CO2 23 08/09/2022 1634   GLUCOSE 116 (H) 08/09/2022 1634   BUN 31 (H) 08/09/2022 1634   CREATININE 1.61 (H) 08/09/2022 1634   CALCIUM 8.3 (L) 08/09/2022 1634   PROT 7.1 08/09/2022 1634   ALBUMIN 4.4 08/09/2022 1634   AST 68 (H) 08/09/2022 1634   ALT 79 (H) 08/09/2022 1634   ALKPHOS 52 08/09/2022 1634   BILITOT 1.7 (H) 08/09/2022 1634   GFRNONAA 40 (L) 08/09/2022 1634     No results found.     Assessment & Plan:   1. Lymphedema (Primary) Today the patient's lower extremity edema is doing much better than it was previously.  He is very diligent with the use of his lymphedema pump in addition to elevation.  At this time the patient want to follow-up on a as needed basis.   2. Essential hypertension Continue  antihypertensive medications as already ordered, these medications have been reviewed and there are no changes at this time.  3. Pulsatile tinnitus of left ear The patient describes what sounds to be pulsatile tinnitus.  I discussed that this could be as a result of possible carotid stenosis.  I  offered return visit for carotid duplex to evaluate however the patient wishes to follow-up as needed and did not wish to move forward with the evaluation.   Current Outpatient Medications on File Prior to Visit  Medication Sig Dispense Refill   acetaminophen  (TYLENOL ) 650 MG CR tablet Take 1,300 mg by mouth at bedtime.     amLODipine  (NORVASC ) 5 MG tablet Take 1 tablet (5 mg total) by mouth daily. 30 tablet 2   aspirin EC 81 MG tablet Take by mouth.     budesonide -formoterol  (SYMBICORT ) 80-4.5 MCG/ACT inhaler Inhale 2 puffs into the lungs 2 (two) times daily. 1 Inhaler 0   docusate sodium  (COLACE) 100 MG capsule Take 1 capsule (100 mg total) by mouth 2 (two) times daily. (Patient taking differently: Take 100 mg by mouth at bedtime.) 10 capsule 0   furosemide  (LASIX ) 20 MG tablet Take 20 mg by mouth daily.     gabapentin  (NEURONTIN ) 600 MG tablet Take 600 mg by mouth at bedtime.     latanoprost  (XALATAN ) 0.005 % ophthalmic solution Place 1 drop into both eyes at bedtime.     lisinopril  (PRINIVIL ,ZESTRIL ) 40 MG tablet Take 40 mg by mouth.     losartan  (COZAAR ) 100 MG tablet Take 100 mg by mouth daily.     Multiple Vitamins-Minerals (PRESERVISION AREDS) CAPS Take 1 capsule by mouth daily.      oxyCODONE  (OXY IR/ROXICODONE ) 5 MG immediate release tablet Take 1 tablet (5 mg total) by mouth every 4 (four) hours as needed for moderate pain or severe pain. 30 tablet 0   simvastatin  (ZOCOR ) 20 MG tablet Take 20 mg by mouth every evening.      vitamin B-12 (CYANOCOBALAMIN ) 1000 MCG tablet Take 1,000 mcg by mouth daily.     No current facility-administered medications on file prior to visit.    There are no  Patient Instructions on file for this visit. No follow-ups on file.   Jimel Myler E Ameliah Baskins, NP
